# Patient Record
Sex: Female | Born: 1945
Health system: Southern US, Community
[De-identification: ages and names within clinical notes are randomized; demographics above are authoritative.]

## PROBLEM LIST (undated history)

## (undated) DIAGNOSIS — I10 Essential (primary) hypertension: Secondary | ICD-10-CM

## (undated) DIAGNOSIS — K635 Polyp of colon: Secondary | ICD-10-CM

## (undated) DIAGNOSIS — R161 Splenomegaly, not elsewhere classified: Secondary | ICD-10-CM

## (undated) DIAGNOSIS — Z973 Presence of spectacles and contact lenses: Secondary | ICD-10-CM

## (undated) DIAGNOSIS — R112 Nausea with vomiting, unspecified: Secondary | ICD-10-CM

## (undated) DIAGNOSIS — R040 Epistaxis: Secondary | ICD-10-CM

## (undated) DIAGNOSIS — D219 Benign neoplasm of connective and other soft tissue, unspecified: Secondary | ICD-10-CM

## (undated) DIAGNOSIS — K279 Peptic ulcer, site unspecified, unspecified as acute or chronic, without hemorrhage or perforation: Secondary | ICD-10-CM

## (undated) DIAGNOSIS — E039 Hypothyroidism, unspecified: Secondary | ICD-10-CM

## (undated) DIAGNOSIS — R011 Cardiac murmur, unspecified: Secondary | ICD-10-CM

## (undated) DIAGNOSIS — H409 Unspecified glaucoma: Secondary | ICD-10-CM

## (undated) DIAGNOSIS — K219 Gastro-esophageal reflux disease without esophagitis: Secondary | ICD-10-CM

## (undated) DIAGNOSIS — Z9889 Other specified postprocedural states: Secondary | ICD-10-CM

## (undated) DIAGNOSIS — M1990 Unspecified osteoarthritis, unspecified site: Secondary | ICD-10-CM

## (undated) HISTORY — DX: Splenomegaly, not elsewhere classified: R16.1

## (undated) HISTORY — DX: Hypothyroidism, unspecified: E03.9

## (undated) HISTORY — PX: APPENDECTOMY: SHX54

## (undated) HISTORY — DX: Peptic ulcer, site unspecified, unspecified as acute or chronic, without hemorrhage or perforation: K27.9

## (undated) HISTORY — PX: THYROIDECTOMY: SHX17

## (undated) HISTORY — DX: Cardiac murmur, unspecified: R01.1

## (undated) HISTORY — PX: WISDOM TOOTH EXTRACTION: SHX21

## (undated) HISTORY — PX: ABDOMINAL HYSTERECTOMY: SHX81

## (undated) HISTORY — PX: CATARACT EXTRACTION W/ INTRAOCULAR LENS  IMPLANT, BILATERAL: SHX1307

## (undated) HISTORY — DX: Benign neoplasm of connective and other soft tissue, unspecified: D21.9

## (undated) HISTORY — DX: Gastro-esophageal reflux disease without esophagitis: K21.9

## (undated) HISTORY — PX: KNEE SURGERY: SHX244

## (undated) HISTORY — PX: BREAST SURGERY: SHX581

## (undated) HISTORY — DX: Polyp of colon: K63.5

## (undated) HISTORY — PX: CHOLECYSTECTOMY: SHX55

## (undated) HISTORY — PX: HERNIA REPAIR: SHX51

## (undated) HISTORY — PX: DENTAL SURGERY: SHX609

---

## 1998-01-14 ENCOUNTER — Ambulatory Visit: Admission: RE | Admit: 1998-01-14 | Discharge: 1998-01-14 | Payer: Self-pay | Admitting: Family Medicine

## 1998-08-17 ENCOUNTER — Ambulatory Visit: Admission: RE | Admit: 1998-08-17 | Discharge: 1998-08-17 | Payer: Self-pay | Admitting: Family Medicine

## 1998-08-17 ENCOUNTER — Encounter: Payer: Self-pay | Admitting: Family Medicine

## 1998-12-30 ENCOUNTER — Ambulatory Visit (HOSPITAL_COMMUNITY): Admission: RE | Admit: 1998-12-30 | Discharge: 1998-12-30 | Payer: Self-pay | Admitting: Family Medicine

## 1998-12-30 ENCOUNTER — Encounter: Payer: Self-pay | Admitting: Family Medicine

## 1999-01-28 ENCOUNTER — Encounter: Payer: Self-pay | Admitting: Surgery

## 1999-01-31 ENCOUNTER — Encounter: Payer: Self-pay | Admitting: Surgery

## 1999-02-01 ENCOUNTER — Encounter: Payer: Self-pay | Admitting: Surgery

## 1999-02-01 ENCOUNTER — Encounter: Payer: Self-pay | Admitting: Gastroenterology

## 1999-02-02 ENCOUNTER — Inpatient Hospital Stay (HOSPITAL_COMMUNITY): Admission: RE | Admit: 1999-02-02 | Discharge: 1999-02-05 | Payer: Self-pay | Admitting: Surgery

## 1999-02-14 ENCOUNTER — Encounter: Payer: Self-pay | Admitting: Gastroenterology

## 1999-02-14 ENCOUNTER — Ambulatory Visit (HOSPITAL_COMMUNITY): Admission: RE | Admit: 1999-02-14 | Discharge: 1999-02-14 | Payer: Self-pay | Admitting: Gastroenterology

## 2000-03-05 ENCOUNTER — Encounter: Admission: RE | Admit: 2000-03-05 | Discharge: 2000-03-05 | Payer: Self-pay | Admitting: *Deleted

## 2000-03-05 ENCOUNTER — Encounter: Payer: Self-pay | Admitting: *Deleted

## 2001-03-08 ENCOUNTER — Encounter: Admission: RE | Admit: 2001-03-08 | Discharge: 2001-03-08 | Payer: Self-pay | Admitting: *Deleted

## 2001-03-08 ENCOUNTER — Encounter: Payer: Self-pay | Admitting: *Deleted

## 2001-06-29 ENCOUNTER — Emergency Department (HOSPITAL_COMMUNITY): Admission: AC | Admit: 2001-06-29 | Discharge: 2001-06-29 | Payer: Self-pay

## 2001-06-29 ENCOUNTER — Encounter: Payer: Self-pay | Admitting: Emergency Medicine

## 2001-11-29 ENCOUNTER — Encounter: Admission: RE | Admit: 2001-11-29 | Discharge: 2001-11-29 | Payer: Self-pay | Admitting: *Deleted

## 2001-11-29 ENCOUNTER — Encounter: Payer: Self-pay | Admitting: *Deleted

## 2002-01-30 ENCOUNTER — Other Ambulatory Visit: Admission: RE | Admit: 2002-01-30 | Discharge: 2002-01-30 | Payer: Self-pay | Admitting: *Deleted

## 2002-02-18 ENCOUNTER — Ambulatory Visit (HOSPITAL_COMMUNITY): Admission: RE | Admit: 2002-02-18 | Discharge: 2002-02-18 | Payer: Self-pay | Admitting: Gastroenterology

## 2002-02-18 ENCOUNTER — Encounter (INDEPENDENT_AMBULATORY_CARE_PROVIDER_SITE_OTHER): Payer: Self-pay

## 2002-03-11 ENCOUNTER — Encounter: Admission: RE | Admit: 2002-03-11 | Discharge: 2002-03-11 | Payer: Self-pay | Admitting: *Deleted

## 2002-03-11 ENCOUNTER — Encounter: Payer: Self-pay | Admitting: *Deleted

## 2003-03-16 ENCOUNTER — Other Ambulatory Visit: Admission: RE | Admit: 2003-03-16 | Discharge: 2003-03-16 | Payer: Self-pay | Admitting: *Deleted

## 2003-03-18 ENCOUNTER — Encounter: Payer: Self-pay | Admitting: Family Medicine

## 2003-03-18 ENCOUNTER — Encounter: Admission: RE | Admit: 2003-03-18 | Discharge: 2003-03-18 | Payer: Self-pay | Admitting: Family Medicine

## 2003-11-26 ENCOUNTER — Encounter: Admission: RE | Admit: 2003-11-26 | Discharge: 2003-11-26 | Payer: Self-pay | Admitting: Family Medicine

## 2004-02-04 ENCOUNTER — Encounter: Admission: RE | Admit: 2004-02-04 | Discharge: 2004-02-04 | Payer: Self-pay | Admitting: *Deleted

## 2004-02-23 ENCOUNTER — Other Ambulatory Visit: Admission: RE | Admit: 2004-02-23 | Discharge: 2004-02-23 | Payer: Self-pay | Admitting: *Deleted

## 2005-02-02 ENCOUNTER — Other Ambulatory Visit: Admission: RE | Admit: 2005-02-02 | Discharge: 2005-02-02 | Payer: Self-pay | Admitting: *Deleted

## 2005-02-06 ENCOUNTER — Encounter: Admission: RE | Admit: 2005-02-06 | Discharge: 2005-02-06 | Payer: Self-pay | Admitting: *Deleted

## 2005-02-14 ENCOUNTER — Emergency Department (HOSPITAL_COMMUNITY): Admission: EM | Admit: 2005-02-14 | Discharge: 2005-02-14 | Payer: Self-pay | Admitting: Emergency Medicine

## 2005-02-22 ENCOUNTER — Emergency Department (HOSPITAL_COMMUNITY): Admission: EM | Admit: 2005-02-22 | Discharge: 2005-02-22 | Payer: Self-pay | Admitting: *Deleted

## 2006-02-07 ENCOUNTER — Other Ambulatory Visit: Admission: RE | Admit: 2006-02-07 | Discharge: 2006-02-07 | Payer: Self-pay | Admitting: *Deleted

## 2006-02-09 ENCOUNTER — Encounter: Admission: RE | Admit: 2006-02-09 | Discharge: 2006-02-09 | Payer: Self-pay | Admitting: *Deleted

## 2007-02-11 ENCOUNTER — Encounter: Admission: RE | Admit: 2007-02-11 | Discharge: 2007-02-11 | Payer: Self-pay | Admitting: *Deleted

## 2007-04-29 ENCOUNTER — Ambulatory Visit (HOSPITAL_COMMUNITY): Admission: RE | Admit: 2007-04-29 | Discharge: 2007-04-29 | Payer: Self-pay | Admitting: Orthopedic Surgery

## 2007-04-29 ENCOUNTER — Encounter (INDEPENDENT_AMBULATORY_CARE_PROVIDER_SITE_OTHER): Payer: Self-pay | Admitting: Orthopedic Surgery

## 2007-04-29 ENCOUNTER — Ambulatory Visit: Payer: Self-pay | Admitting: Vascular Surgery

## 2007-05-03 ENCOUNTER — Ambulatory Visit (HOSPITAL_COMMUNITY): Admission: RE | Admit: 2007-05-03 | Discharge: 2007-05-03 | Payer: Self-pay | Admitting: Orthopedic Surgery

## 2007-05-23 ENCOUNTER — Other Ambulatory Visit: Admission: RE | Admit: 2007-05-23 | Discharge: 2007-05-23 | Payer: Self-pay | Admitting: *Deleted

## 2008-02-12 ENCOUNTER — Encounter: Admission: RE | Admit: 2008-02-12 | Discharge: 2008-02-12 | Payer: Self-pay | Admitting: Gynecology

## 2008-06-04 ENCOUNTER — Other Ambulatory Visit: Admission: RE | Admit: 2008-06-04 | Discharge: 2008-06-04 | Payer: Self-pay | Admitting: Gynecology

## 2009-01-22 ENCOUNTER — Encounter: Admission: RE | Admit: 2009-01-22 | Discharge: 2009-01-22 | Payer: Self-pay | Admitting: Family Medicine

## 2009-02-10 ENCOUNTER — Encounter (INDEPENDENT_AMBULATORY_CARE_PROVIDER_SITE_OTHER): Payer: Self-pay | Admitting: Interventional Radiology

## 2009-02-10 ENCOUNTER — Encounter: Admission: RE | Admit: 2009-02-10 | Discharge: 2009-02-10 | Payer: Self-pay | Admitting: Family Medicine

## 2009-02-10 ENCOUNTER — Other Ambulatory Visit: Admission: RE | Admit: 2009-02-10 | Discharge: 2009-02-10 | Payer: Self-pay | Admitting: Interventional Radiology

## 2009-02-17 ENCOUNTER — Encounter: Admission: RE | Admit: 2009-02-17 | Discharge: 2009-02-17 | Payer: Self-pay | Admitting: Gynecology

## 2009-05-07 ENCOUNTER — Encounter (INDEPENDENT_AMBULATORY_CARE_PROVIDER_SITE_OTHER): Payer: Self-pay | Admitting: Surgery

## 2009-05-07 ENCOUNTER — Ambulatory Visit (HOSPITAL_COMMUNITY): Admission: RE | Admit: 2009-05-07 | Discharge: 2009-05-08 | Payer: Self-pay | Admitting: Surgery

## 2009-07-18 ENCOUNTER — Inpatient Hospital Stay (HOSPITAL_COMMUNITY): Admission: EM | Admit: 2009-07-18 | Discharge: 2009-07-20 | Payer: Self-pay | Admitting: Emergency Medicine

## 2009-07-19 ENCOUNTER — Encounter (INDEPENDENT_AMBULATORY_CARE_PROVIDER_SITE_OTHER): Payer: Self-pay | Admitting: Internal Medicine

## 2009-07-20 ENCOUNTER — Encounter (INDEPENDENT_AMBULATORY_CARE_PROVIDER_SITE_OTHER): Payer: Self-pay | Admitting: Gastroenterology

## 2010-02-18 ENCOUNTER — Encounter: Admission: RE | Admit: 2010-02-18 | Discharge: 2010-02-18 | Payer: Self-pay | Admitting: Gynecology

## 2011-01-05 LAB — BASIC METABOLIC PANEL
BUN: 5 mg/dL — ABNORMAL LOW (ref 6–23)
CO2: 26 mEq/L (ref 19–32)
CO2: 27 mEq/L (ref 19–32)
Chloride: 103 mEq/L (ref 96–112)
Chloride: 109 mEq/L (ref 96–112)
Creatinine, Ser: 0.76 mg/dL (ref 0.4–1.2)
Creatinine, Ser: 0.78 mg/dL (ref 0.4–1.2)
GFR calc Af Amer: >60 mL/min (ref >60)
GFR calc Af Amer: >60 mL/min (ref >60)
Potassium: 3.5 mEq/L (ref 3.5–5.1)

## 2011-01-05 LAB — DIFFERENTIAL
Basophils Relative: 0 % (ref 0–1)
Lymphocytes Relative: 22 % (ref 12–46)
Neutrophils Relative %: 72 % (ref 43–77)

## 2011-01-05 LAB — COMPREHENSIVE METABOLIC PANEL
AST: 21 U/L (ref 0–37)
Albumin: 3.9 g/dL (ref 3.5–5.2)
Alkaline Phosphatase: 60 U/L (ref 39–117)
Chloride: 107 mEq/L (ref 96–112)
GFR calc Af Amer: >60 mL/min (ref >60)
GFR calc non Af Amer: >60 mL/min (ref >60)
Glucose, Bld: 99 mg/dL (ref 70–99)
Potassium: 3.6 mEq/L (ref 3.5–5.1)
Total Bilirubin: 0.4 mg/dL (ref 0.3–1.2)

## 2011-01-05 LAB — CROSSMATCH: Antibody Screen: NEGATIVE

## 2011-01-05 LAB — LIPASE, BLOOD: Lipase: 65 U/L — ABNORMAL HIGH (ref 11–59)

## 2011-01-05 LAB — URINE CULTURE
Colony Count: NO GROWTH
Colony Count: NO GROWTH
Culture: NO GROWTH
Culture: NO GROWTH
Special Requests: NEGATIVE

## 2011-01-05 LAB — URINALYSIS, ROUTINE W REFLEX MICROSCOPIC
Glucose, UA: NEGATIVE mg/dL
Specific Gravity, Urine: 1.008 (ref 1.005–1.030)
pH: 6.5 (ref 5.0–8.0)

## 2011-01-05 LAB — CBC
HCT: 26.8 % — ABNORMAL LOW (ref 36.0–46.0)
HCT: 28.5 % — ABNORMAL LOW (ref 36.0–46.0)
Hemoglobin: 8.9 g/dL — ABNORMAL LOW (ref 12.0–15.0)
MCHC: 32.4 g/dL (ref 30.0–36.0)
MCHC: 33.2 g/dL (ref 30.0–36.0)
MCHC: 33.4 g/dL (ref 30.0–36.0)
MCV: 84.6 fL (ref 78.0–100.0)
MCV: 85.1 fL (ref 78.0–100.0)
MCV: 85.1 fL (ref 78.0–100.0)
MCV: 85.2 fL (ref 78.0–100.0)
MCV: 85.3 fL (ref 78.0–100.0)
MCV: 85.3 fL (ref 78.0–100.0)
Platelets: 274 10*3/uL (ref 150–400)
Platelets: 356 10*3/uL (ref 150–400)
RBC: 2.78 MIL/uL — ABNORMAL LOW (ref 3.87–5.11)
RBC: 2.97 MIL/uL — ABNORMAL LOW (ref 3.87–5.11)
RBC: 3.12 MIL/uL — ABNORMAL LOW (ref 3.87–5.11)
RBC: 3.14 MIL/uL — ABNORMAL LOW (ref 3.87–5.11)
RBC: 3.35 MIL/uL — ABNORMAL LOW (ref 3.87–5.11)
RDW: 15.9 % — ABNORMAL HIGH (ref 11.5–15.5)
RDW: 16.3 % — ABNORMAL HIGH (ref 11.5–15.5)
WBC: 4.3 10*3/uL (ref 4.0–10.5)
WBC: 5.4 10*3/uL (ref 4.0–10.5)
WBC: 7.4 10*3/uL (ref 4.0–10.5)

## 2011-01-05 LAB — POCT CARDIAC MARKERS
Myoglobin, poc: 27.3 ng/mL (ref 12–200)
Myoglobin, poc: 37.3 ng/mL (ref 12–200)
Troponin i, poc: <0.05 ng/mL (ref 0.00–0.09)

## 2011-01-05 LAB — PROTIME-INR
INR: 1.09 (ref 0.00–1.49)
Prothrombin Time: 14 seconds (ref 11.6–15.2)

## 2011-01-05 LAB — CK TOTAL AND CKMB (NOT AT ARMC): Relative Index: INVALID (ref 0.0–2.5)

## 2011-01-07 LAB — COMPREHENSIVE METABOLIC PANEL
ALT: 15 U/L (ref 0–35)
AST: 21 U/L (ref 0–37)
Albumin: 4.1 g/dL (ref 3.5–5.2)
Alkaline Phosphatase: 66 U/L (ref 39–117)
BUN: 13 mg/dL (ref 6–23)
Chloride: 107 mEq/L (ref 96–112)
GFR calc Af Amer: >60 mL/min (ref >60)
Potassium: 4.3 mEq/L (ref 3.5–5.1)
Sodium: 141 mEq/L (ref 135–145)
Total Bilirubin: 0.6 mg/dL (ref 0.3–1.2)
Total Protein: 7 g/dL (ref 6.0–8.3)

## 2011-01-07 LAB — URINALYSIS, ROUTINE W REFLEX MICROSCOPIC
Glucose, UA: NEGATIVE mg/dL
Hgb urine dipstick: NEGATIVE
Ketones, ur: NEGATIVE mg/dL
Protein, ur: NEGATIVE mg/dL
pH: 6 (ref 5.0–8.0)

## 2011-01-07 LAB — CBC
HCT: 39 % (ref 36.0–46.0)
Platelets: 183 10*3/uL (ref 150–400)
RDW: 14.4 % (ref 11.5–15.5)
WBC: 4.6 10*3/uL (ref 4.0–10.5)

## 2011-01-07 LAB — DIFFERENTIAL
Basophils Absolute: 0 10*3/uL (ref 0.0–0.1)
Basophils Relative: 1 % (ref 0–1)
Eosinophils Relative: 3 % (ref 0–5)
Monocytes Absolute: 0.3 10*3/uL (ref 0.1–1.0)
Monocytes Relative: 7 % (ref 3–12)

## 2011-01-07 LAB — CALCIUM: Calcium: 8 mg/dL — ABNORMAL LOW (ref 8.4–10.5)

## 2011-02-14 NOTE — Op Note (Signed)
NAMESHIRLE, PROVENCAL                 ACCOUNT NO.:  [id number]   MEDICAL RECORD NO.:  [id number]          PATIENT TYPE:  AMB   LOCATION:  DAY                          FACILITY:  [facility]   PHYSICIAN:  Velora Heckler, MD      DATE OF BIRTH:  [birth year]   DATE OF PROCEDURE:  05/07/2009  DATE OF DISCHARGE:                               OPERATIVE REPORT   PREOPERATIVE DIAGNOSIS:  Multinodular thyroid goiter with compressive  symptoms.   POSTOPERATIVE DIAGNOSIS:  Multinodular thyroid goiter with compressive  symptoms.   PROCEDURE:  Total thyroidectomy.   SURGEON:  Velora Heckler, M.D., FACS   ASSISTANT:  Gaynelle Adu, M.D.   ANESTHESIA:  General.   ESTIMATED BLOOD LOSS:  Minimal.   PREPARATION:  ChloraPrep.   COMPLICATIONS:  None.   INDICATIONS:  The patient is a 65 year old white female from [place],  [place].  She is seen at the request of Dr. Talmage Coin for a  dominant right-sided thyroid nodule.  Ultrasound showed a multinodular  gland with a dominant mass in the right lobe containing  microcalcifications.  Fine-needle aspiration showed a follicular lesion.  The patient has developed mild compressive symptoms including dysphagia  and chronic cough.  She now comes to surgery for thyroidectomy.   DESCRIPTION OF PROCEDURE:  Procedure is done in OR #11 at the [facility].  The patient is brought to the operating room,  placed in the supine position on the operating room table.  Following  administration of general anesthesia, the patient is positioned and then  prepped and draped in the usual strict aseptic fashion.  After  ascertaining that an adequate level of anesthesia had been achieved, a  Kocher incision is made with a #15 blade.  Dissection is carried through  subcutaneous tissues and platysma.  Hemostasis is obtained with the  electrocautery.  Skin flaps are elevated cephalad and caudad from the  thyroid notch to the sternal notch.  A Mahorner  self-retaining retractor  is placed for exposure.  Strap muscles are incised in the midline and  dissection is begun on the left side.  Left lobe is exposed.  Left  thyroid lobe is essentially normal in size, although it does contain  multiple small nodules measuring up to 1 cm in diameter.  It is gently  dissected out.  Middle thyroid vein is divided between small Ligaclips  with the Harmonic scalpel.  Superior pole vessels are dissected out and  ligated in continuity with 2-0 silk ties and divided with the Harmonic  scalpel.  Superior parathyroid gland is identified and preserved on its  vascular pedicle.  Inferior venous tributaries are divided between  medium Ligaclips with the Harmonic scalpel.  Parathyroid tissue at the  inferior position is also identified and preserved on its vascular  pedicle.  Branches of the inferior thyroid artery are divided between  small Ligaclips with the Harmonic scalpel.  The recurrent laryngeal  nerve is identified and preserved.  Ligament of Allyson Sabal is transected with  the electrocautery and the gland is mobilized up and onto the  anterior  trachea.  Isthmus is mobilized across the midline.  There is a small  pyramidal lobe which is resected en bloc with the gland.  There is also  a small remnant of thyroid tissue extending inferiorly from the isthmus  which is mobilized.  Venous tributaries are divided between medium  Ligaclips with the Harmonic scalpel.  This was also resected en bloc  with the thyroid.  Dry pack is placed in the left neck.   Next we turned our attention to the right thyroid lobe.  Again strap  muscles are reflected laterally.  The right lobe is markedly enlarged.  It contains a dominant mass extending posteriorly from the mid pole.  Middle thyroid vein is divided between medium Ligaclips with the  Harmonic scalpel.  The superior pole vessels are dissected out and  divided between medium Ligaclips with the Harmonic scalpel.   Parathyroid  tissue is identified and preserved.  Inferior venous tributaries were  divided between medium Ligaclips.  Gland is rolled anteriorly.  Branches  of the inferior thyroid artery are divided between small Ligaclips with  the Harmonic scalpel.  Inferior parathyroid gland is identified and  preserved on its vascular pedicle.  Recurrent nerve is identified and  preserved.  Ligament of Allyson Sabal is transected with the electrocautery and  the gland is mobilized up and onto the anterior trachea.  Inferior  venous tributaries are ligated with 2-0 silk ties and divided with the  Harmonic scalpel.  The entire gland is excised.  Suture is used to mark  the right superior pole.  The entire specimen is submitted to pathology  for review.  Neck is irrigated with warm saline.  Good hemostasis is  noted.  Surgicel is placed in the operative field bilaterally.  Strap  muscles are reapproximated in the midline with interrupted 3-0 Vicryl  sutures.  Platysma is closed with interrupted 3-0 Vicryl sutures.  Skin  is closed with a running 4-0 Monocryl subcuticular suture.  Wound is  washed and dried and Benzoin and Steri-Strips are applied.  Sterile  dressings are applied.  The patient is awakened from anesthesia and  brought to the recovery room in stable condition.  The patient tolerated  the procedure well.      Velora Heckler, MD  Electronically Signed     TMG/MEDQ  D:  05/07/2009  T:  05/07/2009  Job:  [id number]   cc:   Tonita Cong, M.D.   Vianne Bulls, M.D.  Fax: [phone]

## 2016-04-19 ENCOUNTER — Other Ambulatory Visit: Payer: Self-pay | Admitting: Family Medicine

## 2016-04-19 DIAGNOSIS — R1031 Right lower quadrant pain: Secondary | ICD-10-CM

## 2016-05-01 ENCOUNTER — Other Ambulatory Visit: Payer: Self-pay

## 2016-05-02 ENCOUNTER — Ambulatory Visit
Admission: RE | Admit: 2016-05-02 | Discharge: 2016-05-02 | Disposition: A | Discharge status: Home / Self Care | Payer: Medicare Other | Source: Ambulatory Visit | Attending: Family Medicine | Admitting: Family Medicine

## 2016-05-02 DIAGNOSIS — R1031 Right lower quadrant pain: Secondary | ICD-10-CM

## 2016-05-02 MED ORDER — IOPAMIDOL (ISOVUE-300) INJECTION 61%
100.0000 mL | Freq: Once | INTRAVENOUS | Status: AC | PRN
Start: 2016-05-02 — End: 2016-05-02
  Administered 2016-05-02: 100 mL via INTRAVENOUS

## 2016-05-04 ENCOUNTER — Other Ambulatory Visit: Payer: Self-pay | Admitting: Family Medicine

## 2016-05-04 DIAGNOSIS — R935 Abnormal findings on diagnostic imaging of other abdominal regions, including retroperitoneum: Secondary | ICD-10-CM

## 2016-05-12 ENCOUNTER — Ambulatory Visit
Admission: RE | Admit: 2016-05-12 | Discharge: 2016-05-12 | Disposition: A | Discharge status: Home / Self Care | Payer: Medicare Other | Source: Ambulatory Visit | Attending: Family Medicine | Admitting: Family Medicine

## 2016-05-12 DIAGNOSIS — R935 Abnormal findings on diagnostic imaging of other abdominal regions, including retroperitoneum: Secondary | ICD-10-CM

## 2016-05-12 MED ORDER — GADOBENATE DIMEGLUMINE 529 MG/ML IV SOLN
15.0000 mL | Freq: Once | INTRAVENOUS | Status: AC | PRN
  Administered 2016-05-12: 15 mL via INTRAVENOUS

## 2016-05-15 ENCOUNTER — Ambulatory Visit
Admission: RE | Admit: 2016-05-15 | Discharge: 2016-05-15 | Disposition: A | Discharge status: Home / Self Care | Payer: Medicare Other | Source: Ambulatory Visit | Attending: Family Medicine | Admitting: Family Medicine

## 2016-05-15 ENCOUNTER — Encounter: Payer: Self-pay | Admitting: Radiology

## 2016-05-15 DIAGNOSIS — R935 Abnormal findings on diagnostic imaging of other abdominal regions, including retroperitoneum: Secondary | ICD-10-CM

## 2016-05-15 MED ORDER — IOPAMIDOL (ISOVUE-300) INJECTION 61%
75.0000 mL | Freq: Once | INTRAVENOUS | Status: AC | PRN
  Administered 2016-05-15: 75 mL via INTRAVENOUS

## 2016-07-20 ENCOUNTER — Other Ambulatory Visit: Payer: Self-pay | Admitting: Family Medicine

## 2016-07-24 ENCOUNTER — Ambulatory Visit
Admission: RE | Admit: 2016-07-24 | Discharge: 2016-07-24 | Disposition: A | Discharge status: Home / Self Care | Payer: Self-pay | Source: Ambulatory Visit | Attending: Family Medicine | Admitting: Family Medicine

## 2016-08-12 DIAGNOSIS — S065XAA Traumatic subdural hemorrhage with loss of consciousness status unknown, initial encounter: Secondary | ICD-10-CM | POA: Insufficient documentation

## 2016-08-16 ENCOUNTER — Other Ambulatory Visit: Payer: Self-pay | Admitting: Family Medicine

## 2016-08-16 DIAGNOSIS — S3600XA Unspecified injury of spleen, initial encounter: Secondary | ICD-10-CM

## 2016-08-30 ENCOUNTER — Ambulatory Visit
Admission: RE | Admit: 2016-08-30 | Discharge: 2016-08-30 | Disposition: A | Discharge status: Home / Self Care | Payer: Medicare Other | Source: Ambulatory Visit | Attending: Family Medicine | Admitting: Family Medicine

## 2016-08-30 DIAGNOSIS — S3600XA Unspecified injury of spleen, initial encounter: Secondary | ICD-10-CM

## 2016-08-30 MED ORDER — GADOBENATE DIMEGLUMINE 529 MG/ML IV SOLN
15.0000 mL | Freq: Once | INTRAVENOUS | Status: AC | PRN
  Administered 2016-08-30: 15 mL via INTRAVENOUS

## 2016-10-20 ENCOUNTER — Other Ambulatory Visit: Payer: Self-pay | Admitting: Neurological Surgery

## 2016-10-20 DIAGNOSIS — S065X0A Traumatic subdural hemorrhage without loss of consciousness, initial encounter: Secondary | ICD-10-CM

## 2016-10-23 ENCOUNTER — Ambulatory Visit
Admission: RE | Admit: 2016-10-23 | Discharge: 2016-10-23 | Disposition: A | Discharge status: Home / Self Care | Payer: Medicare Other | Source: Ambulatory Visit | Attending: Neurological Surgery | Admitting: Neurological Surgery

## 2016-10-23 DIAGNOSIS — S065X0A Traumatic subdural hemorrhage without loss of consciousness, initial encounter: Secondary | ICD-10-CM

## 2018-02-05 DIAGNOSIS — R2232 Localized swelling, mass and lump, left upper limb: Secondary | ICD-10-CM | POA: Insufficient documentation

## 2018-02-13 ENCOUNTER — Other Ambulatory Visit: Payer: Self-pay

## 2018-04-15 ENCOUNTER — Other Ambulatory Visit: Payer: Self-pay | Admitting: Obstetrics & Gynecology

## 2018-04-15 DIAGNOSIS — N644 Mastodynia: Secondary | ICD-10-CM

## 2018-04-18 ENCOUNTER — Ambulatory Visit
Admission: RE | Admit: 2018-04-18 | Discharge: 2018-04-18 | Disposition: A | Discharge status: Home / Self Care | Payer: Medicare Other | Source: Ambulatory Visit | Attending: Obstetrics & Gynecology | Admitting: Obstetrics & Gynecology

## 2018-04-18 ENCOUNTER — Ambulatory Visit: Admission: RE | Admit: 2018-04-18 | Payer: Medicare Other | Source: Ambulatory Visit

## 2018-04-18 DIAGNOSIS — N644 Mastodynia: Secondary | ICD-10-CM

## 2018-10-16 ENCOUNTER — Emergency Department (HOSPITAL_BASED_OUTPATIENT_CLINIC_OR_DEPARTMENT_OTHER)
Admission: EM | Admit: 2018-10-16 | Discharge: 2018-10-16 | Disposition: A | Discharge status: Home / Self Care | Payer: Medicare Other | Attending: Emergency Medicine | Admitting: Emergency Medicine

## 2018-10-16 ENCOUNTER — Other Ambulatory Visit: Payer: Self-pay

## 2018-10-16 ENCOUNTER — Encounter (HOSPITAL_BASED_OUTPATIENT_CLINIC_OR_DEPARTMENT_OTHER): Payer: Self-pay | Admitting: *Deleted

## 2018-10-16 DIAGNOSIS — R040 Epistaxis: Secondary | ICD-10-CM

## 2018-10-16 HISTORY — DX: Epistaxis: R04.0

## 2018-10-16 MED ORDER — SILVER NITRATE-POT NITRATE 75-25 % EX MISC
1.0000 "application " | Freq: Once | CUTANEOUS | Status: DC
  Filled 2018-10-16: qty 1

## 2018-10-16 NOTE — ED Provider Notes (Signed)
[place] EMERGENCY DEPARTMENT Provider Note   CSN: [id number] Arrival date & time: 10/16/18  1514     History   Chief Complaint Chief Complaint  Patient presents with  . Epistaxis    HPI Miranda Russell is a 73 y.o. female.  HPI Patient presents to the emergency department with a nosebleed that started about 30 minutes prior to arrival.  The patient states that she feels like is coming from the front part of the left nose.  Patient states she has had to these over the last 3 weeks.  Patient states seems to happen mostly in the wintertime.  The patient states she did not take any medications prior to arrival for her symptoms.  She states she did apply pressure.  She denies any headache, blurred vision, weakness, numbness, dizziness, neck pain, shortness of breath or syncope. Past Medical History:  Diagnosis Date  . Epistaxis     There are no active problems to display for this patient.   History reviewed. No pertinent surgical history.   OB History   No obstetric history on file.      Home Medications    Prior to Admission medications   Not on File    Family History Family History  Problem Relation Age of Onset  . Breast cancer Neg Hx     Social History Social History   Tobacco Use  . Smoking status: Never Smoker  . Smokeless tobacco: Never Used  Substance Use Topics  . Alcohol use: Not on file  . Drug use: Never     Allergies   Morphine and related and Codeine   Review of Systems Review of Systems  All other systems negative except as documented in the HPI. All pertinent positives and negatives as reviewed in the HPI. Physical Exam Updated Vital Signs BP (!) 162/101 (BP Location: Right Arm)   Pulse 88   Temp 97.7 F (36.5 C) (Oral)   Resp 16   Ht 5' 6.5" (1.689 m)   Wt 75.3 kg   SpO2 98%   BMI 26.39 kg/m   Physical Exam Vitals signs and nursing note reviewed.  Constitutional:      General: She is not in acute distress.   Appearance: She is well-developed.  HENT:     Head: Normocephalic and atraumatic.     Nose:   Eyes:     Pupils: Pupils are equal, round, and reactive to light.  Pulmonary:     Effort: Pulmonary effort is normal.  Skin:    General: Skin is warm and dry.  Neurological:     Mental Status: She is alert and oriented to person, place, and time.      ED Treatments / Results  Labs (all labs ordered are listed, but only abnormal results are displayed) Labs Reviewed - No data to display  EKG None  Radiology No results found.  Procedures .Epistaxis Management Date/Time: 10/16/2018 5:16 PM Performed by: Dalia Heading, PA-C Authorized by: Dalia Heading, PA-C   Consent:    Risks discussed:  Pain Procedure details:    Treatment site:  R anterior and L anterior   Treatment method:  Silver nitrate   Treatment complexity:  Limited   Treatment episode: initial   Post-procedure details:    Assessment:  Bleeding stopped   Patient tolerance of procedure:  Tolerated well, no immediate complications   (including critical care time)  Medications Ordered in ED Medications  silver nitrate applicators applicator 1 application (has no administration  in time range)     Initial Impression / Assessment and Plan / ED Course  I have reviewed the triage vital signs and the nursing notes.  Pertinent labs & imaging results that were available during my care of the patient were reviewed by me and considered in my medical decision making (see chart for details).     Patient was observed for about 30 minutes following the silver nitrate.  She does not have any bleeding.  I advised her to minimize manipulation of the nose.  Patient agrees to plan and all questions were answered.  I did advise her to follow-up with her doctor.  Final Clinical Impressions(s) / ED Diagnoses   Final diagnoses:  None    ED Discharge Orders    None       Rebeca Allegra 10/16/18  1717    Jola Schmidt, MD 10/16/18 [phone]

## 2018-10-16 NOTE — Discharge Instructions (Addendum)
Return here as needed.  Follow-up with your primary doctor. °

## 2018-10-16 NOTE — ED Triage Notes (Signed)
Nose has been bleeding for 20 mins. Pt went to urgent care and was sent here.

## 2018-11-04 ENCOUNTER — Emergency Department (HOSPITAL_BASED_OUTPATIENT_CLINIC_OR_DEPARTMENT_OTHER)
Admission: EM | Admit: 2018-11-04 | Discharge: 2018-11-04 | Disposition: A | Discharge status: Home / Self Care | Payer: Medicare Other | Attending: Emergency Medicine | Admitting: Emergency Medicine

## 2018-11-04 ENCOUNTER — Encounter (HOSPITAL_BASED_OUTPATIENT_CLINIC_OR_DEPARTMENT_OTHER): Payer: Self-pay

## 2018-11-04 ENCOUNTER — Other Ambulatory Visit: Payer: Self-pay

## 2018-11-04 DIAGNOSIS — R040 Epistaxis: Secondary | ICD-10-CM | POA: Diagnosis not present

## 2018-11-04 DIAGNOSIS — I10 Essential (primary) hypertension: Secondary | ICD-10-CM | POA: Insufficient documentation

## 2018-11-04 HISTORY — DX: Essential (primary) hypertension: I10

## 2018-11-04 MED ORDER — OXYMETAZOLINE HCL 0.05 % NA SOLN
1.0000 | Freq: Once | NASAL | Status: AC
  Administered 2018-11-04: 1 via NASAL
  Filled 2018-11-04: qty 30

## 2018-11-04 MED ORDER — SILVER NITRATE-POT NITRATE 75-25 % EX MISC
1.0000 | Freq: Once | CUTANEOUS | Status: AC
  Administered 2018-11-04: 1 via TOPICAL
  Filled 2018-11-04: qty 1

## 2018-11-04 NOTE — Discharge Instructions (Addendum)
If you have recurrent nosebleed please follow instructions below: Whichever side of your nose is bleeding, squeeze the other side and blow out forcefully to remove all the blood and clot that is bleeding. Right after blowing all the blood and clot spray 3-4 squirts of Afrin in the side that was bleeding until you feel it draining down the back of your throat.  Immediately hold pressure on the soft part of your nose for no less than 10 minutes.  If you accidentally let up start over again. If this does not work repeat steps 1 through 3 one time and if it is still bleeding after that return to the emergency department. Do not hold your head backwards.  Sometimes ice can help.

## 2018-11-04 NOTE — ED Triage Notes (Signed)
C/o nose bleed since 640pm-states she was seen here for same ~2weeks ago-NAD-bleeding from left nare has stopped at this time-NAD-steady gait

## 2018-11-05 NOTE — ED Provider Notes (Signed)
Emergency Department Provider Note   I have reviewed the triage vital signs and the nursing notes.   HISTORY  Chief Complaint Epistaxis   HPI Miranda Russell is a 73 y.o. female who had approximately 1 hour of nosebleed prior to my evaluation but stopped.  Patient states is happened before with high blood pressure.  She does not remember putting foreign objects in her nose.  No other associated symptoms.  No vomiting. No other associated or modifying symptoms.    Past Medical History:  Diagnosis Date  . Epistaxis   . Hypertension     There are no active problems to display for this patient.   Past Surgical History:  Procedure Laterality Date  . ABDOMINAL HYSTERECTOMY    . BREAST SURGERY    . CHOLECYSTECTOMY    . KNEE SURGERY    . THYROIDECTOMY        Allergies Morphine and related and Codeine  Family History  Problem Relation Age of Onset  . Breast cancer Neg Hx     Social History Social History   Tobacco Use  . Smoking status: Never Smoker  . Smokeless tobacco: Never Used  Substance Use Topics  . Alcohol use: Yes    Comment: occ  . Drug use: Never    Review of Systems  All other systems negative except as documented in the HPI. All pertinent positives and negatives as reviewed in the HPI. ____________________________________________   PHYSICAL EXAM:  VITAL SIGNS: ED Triage Vitals  Enc Vitals Group     BP 11/04/18 1931 (!) 186/101     Pulse Rate 11/04/18 1931 83     Resp 11/04/18 1931 18     Temp 11/04/18 1931 97.8 F (36.6 C)     Temp Source 11/04/18 1931 Oral     SpO2 11/04/18 1931 97 %     Weight 11/04/18 1931 168 lb (76.2 kg)     Height 11/04/18 1931 5\' 6"  (1.676 m)    Constitutional: Alert and oriented. Well appearing and in no acute distress. Eyes: Conjunctivae are normal. PERRL. EOMI. Head: Atraumatic. Nose: No congestion/rhinnorhea.  Small area that appeared that it was bleeding earlier today on the nasal septum in the left  nare. Mouth/Throat: Mucous membranes are moist.  Oropharynx non-erythematous. Neck: No stridor.  No meningeal signs.   Cardiovascular: Normal rate, regular rhythm. Good peripheral circulation. Grossly normal heart sounds.   Respiratory: Normal respiratory effort.  No retractions. Lungs CTAB. Gastrointestinal: Soft and nontender. No distention.  Musculoskeletal: No lower extremity tenderness nor edema. No gross deformities of extremities. Neurologic:  Normal speech and language. No gross focal neurologic deficits are appreciated.  Skin:  Skin is warm, dry and intact. No rash noted.  _________________________________________    INITIAL IMPRESSION / ASSESSMENT AND PLAN / ED COURSE  Cauterize that area that appeared to have bleeding recently. Instructions for treatmetn at home.   Pertinent labs & imaging results that were available during my care of the patient were reviewed by me and considered in my medical decision making (see chart for details).  ____________________________________________  FINAL CLINICAL IMPRESSION(S) / ED DIAGNOSES  Final diagnoses:  Epistaxis    MEDICATIONS GIVEN DURING THIS VISIT:  Medications  silver nitrate applicators applicator 1 Stick (1 Stick Topical Given 11/04/18 2135)  oxymetazoline (AFRIN) 0.05 % nasal spray 1 spray (1 spray Each Nare Given 11/04/18 2135)     NEW OUTPATIENT MEDICATIONS STARTED DURING THIS VISIT:  There are no discharge medications for this  patient.   Note:  This note was prepared with assistance of Dragon voice recognition software. Occasional wrong-word or sound-a-like substitutions may have occurred due to the inherent limitations of voice recognition software.   Orelia Brandstetter, Corene Cornea, MD 11/05/18 [phone]

## 2018-11-21 DIAGNOSIS — R040 Epistaxis: Secondary | ICD-10-CM

## 2018-11-21 HISTORY — DX: Epistaxis: R04.0

## 2018-12-05 DIAGNOSIS — K219 Gastro-esophageal reflux disease without esophagitis: Secondary | ICD-10-CM | POA: Insufficient documentation

## 2018-12-05 DIAGNOSIS — I10 Essential (primary) hypertension: Secondary | ICD-10-CM | POA: Insufficient documentation

## 2018-12-05 DIAGNOSIS — E039 Hypothyroidism, unspecified: Secondary | ICD-10-CM | POA: Insufficient documentation

## 2019-01-22 ENCOUNTER — Other Ambulatory Visit: Payer: Self-pay

## 2019-01-22 ENCOUNTER — Emergency Department (HOSPITAL_COMMUNITY)
Admission: EM | Admit: 2019-01-22 | Discharge: 2019-01-22 | Disposition: A | Discharge status: Home / Self Care | Payer: Medicare Other | Attending: Emergency Medicine | Admitting: Emergency Medicine

## 2019-01-22 ENCOUNTER — Encounter (HOSPITAL_COMMUNITY): Payer: Self-pay | Admitting: *Deleted

## 2019-01-22 DIAGNOSIS — R040 Epistaxis: Secondary | ICD-10-CM | POA: Diagnosis not present

## 2019-01-22 DIAGNOSIS — I10 Essential (primary) hypertension: Secondary | ICD-10-CM | POA: Diagnosis not present

## 2019-01-22 MED ORDER — LIDOCAINE-EPINEPHRINE-TETRACAINE (LET) SOLUTION
3.0000 mL | Freq: Once | NASAL | Status: AC
  Administered 2019-01-22: 3 mL via TOPICAL
  Filled 2019-01-22: qty 3

## 2019-01-22 MED ORDER — CEPHALEXIN 500 MG PO CAPS: 500.0000 mg | ORAL_CAPSULE | Freq: Two times a day (BID) | ORAL | 0 refills | Status: DC

## 2019-01-22 NOTE — ED Provider Notes (Signed)
[place] EMERGENCY DEPARTMENT Provider Note   CSN: [id number] Arrival date & time: 01/22/19  2059    History   Chief Complaint Chief Complaint  Patient presents with  . Epistaxis    HPI NOAM FRANZEN is a 73 y.o. female.     HPI   73 year old female with epistaxis.  History of the same.  Began again this evening from the right side.  Occurred around 1900.  Denies any trauma.  She is not anticoagulated.  No dizziness or lightheadedness.  She is spitting up blood.  EMS tried Afrin without improvement.  Denies any pain.  Past Medical History:  Diagnosis Date  . Epistaxis   . Hypertension     There are no active problems to display for this patient.   Past Surgical History:  Procedure Laterality Date  . ABDOMINAL HYSTERECTOMY    . BREAST SURGERY    . CHOLECYSTECTOMY    . KNEE SURGERY    . THYROIDECTOMY       OB History   No obstetric history on file.      Home Medications    Prior to Admission medications   Not on File    Family History Family History  Problem Relation Age of Onset  . Breast cancer Neg Hx     Social History Social History   Tobacco Use  . Smoking status: Never Smoker  . Smokeless tobacco: Never Used  Substance Use Topics  . Alcohol use: Yes    Comment: occ  . Drug use: Never     Allergies   Morphine and related and Codeine   Review of Systems Review of Systems  All systems reviewed and negative, other than as noted in HPI.  Physical Exam Updated Vital Signs BP (!) 193/119 (BP Location: Left Arm)   Pulse 94   Temp 97.9 F (36.6 C) (Oral)   Resp 16   Ht 5\' 6"  (1.676 m)   Wt 75.3 kg   SpO2 98%   BMI 26.79 kg/m   Physical Exam Vitals signs and nursing note reviewed.  Constitutional:      General: She is not in acute distress.    Appearance: She is well-developed.  HENT:     Head: Normocephalic.     Nose:     Comments: Significant epistaxis from R side and also spitting up blood/clot.  Not anterior.  Eyes:     General:        Right eye: No discharge.        Left eye: No discharge.     Conjunctiva/sclera: Conjunctivae normal.  Neck:     Musculoskeletal: Neck supple.  Cardiovascular:     Rate and Rhythm: Normal rate and regular rhythm.     Heart sounds: Normal heart sounds. No murmur. No friction rub. No gallop.   Pulmonary:     Effort: Pulmonary effort is normal. No respiratory distress.     Breath sounds: Normal breath sounds.  Abdominal:     General: There is no distension.     Palpations: Abdomen is soft.     Tenderness: There is no abdominal tenderness.  Musculoskeletal:        General: No tenderness.  Skin:    General: Skin is warm and dry.  Neurological:     Mental Status: She is alert.  Psychiatric:        Behavior: Behavior normal.        Thought Content: Thought content normal.  ED Treatments / Results  Labs (all labs ordered are listed, but only abnormal results are displayed) Labs Reviewed - No data to display  EKG None  Radiology No results found.  Procedures .Epistaxis Management Date/Time: 01/24/2019 10:59 PM Performed by: Virgel Manifold, MD Authorized by: Virgel Manifold, MD   Consent:    Consent obtained:  Verbal   Consent given by:  Patient   Risks discussed:  Nasal injury and pain Anesthesia (see MAR for exact dosages):    Anesthesia method:  Topical application   Topical anesthetic:  LET Procedure details:    Treatment site:  R posterior   Treatment method:  Nasal balloon   Treatment complexity:  Extensive   Treatment episode: initial   Post-procedure details:    Assessment:  Bleeding stopped   Patient tolerance of procedure:  Tolerated well, no immediate complications   (including critical care time)  Medications Ordered in ED Medications  lidocaine-EPINEPHrine-tetracaine (LET) solution (3 mLs Topical Given 01/22/19 2129)     Initial Impression / Assessment and Plan / ED Course  I have reviewed the triage  vital signs and the nursing notes.  Pertinent labs & imaging results that were available during my care of the patient were reviewed by me and considered in my medical decision making (see chart for details).        73yF with epistaxis. Has been seen recently for the same in the ED and by ENT. Has previously had L anterior septal bleeds. R posterior today. Able to get away with placement of 7.5 rhinorocket. Observed for 1 hour post placement and no further bleeding. ENT FU. Prophylactic abx.   Final Clinical Impressions(s) / ED Diagnoses   Final diagnoses:  Epistaxis    ED Discharge Orders    None       Virgel Manifold, MD 01/24/19 2317

## 2019-01-22 NOTE — ED Notes (Signed)
MD aware of pt BP. Assured pt to take BP medication as follows.

## 2019-01-22 NOTE — ED Triage Notes (Signed)
The pt arrived by gems from home  She has had a nosebleed since 1900 today  Actively bleeding on arrival.  She last had a nosebleed 2 months ago..  Ems attempted afrin but the bleeding was so active that the afrin was not effective.  No pain anywhere

## 2019-03-25 ENCOUNTER — Other Ambulatory Visit: Payer: Self-pay | Admitting: Family Medicine

## 2019-03-25 DIAGNOSIS — R9389 Abnormal findings on diagnostic imaging of other specified body structures: Secondary | ICD-10-CM

## 2019-03-28 ENCOUNTER — Other Ambulatory Visit: Payer: Self-pay | Admitting: Family Medicine

## 2019-03-28 DIAGNOSIS — K7689 Other specified diseases of liver: Secondary | ICD-10-CM

## 2019-03-28 DIAGNOSIS — R161 Splenomegaly, not elsewhere classified: Secondary | ICD-10-CM

## 2019-04-07 ENCOUNTER — Ambulatory Visit (INDEPENDENT_AMBULATORY_CARE_PROVIDER_SITE_OTHER): Payer: Medicare Other

## 2019-04-07 ENCOUNTER — Other Ambulatory Visit: Payer: Self-pay

## 2019-04-07 ENCOUNTER — Ambulatory Visit: Payer: Medicare Other

## 2019-04-07 DIAGNOSIS — K7689 Other specified diseases of liver: Secondary | ICD-10-CM

## 2019-04-07 DIAGNOSIS — R9389 Abnormal findings on diagnostic imaging of other specified body structures: Secondary | ICD-10-CM

## 2019-04-07 DIAGNOSIS — R161 Splenomegaly, not elsewhere classified: Secondary | ICD-10-CM

## 2019-04-07 MED ORDER — GADOBUTROL 1 MMOL/ML IV SOLN
7.5000 mL | Freq: Once | INTRAVENOUS | Status: AC | PRN
  Administered 2019-04-07: 11:00:00 7.5 mL via INTRAVENOUS

## 2019-05-07 ENCOUNTER — Telehealth: Payer: Self-pay

## 2019-05-07 NOTE — Telephone Encounter (Signed)
ATC Patient but she did not answer. Could not leave a VM due to her inbox being full. Will call back later.

## 2019-05-08 NOTE — Telephone Encounter (Signed)
Spoke with patient. She states Dr. Harrington Challenger referred her to our office but cannot find the referral I am going to route this to [facility] and see if she has any information   Patrice please advise

## 2019-05-08 NOTE — Telephone Encounter (Signed)
PT RETURNING CALL AGAIN AND CAN BE REACHED @ [phone].Miranda Russell

## 2019-05-08 NOTE — Telephone Encounter (Signed)
Attempted to call pt but unable to reach and unable to leave a VM due to mailbox being full. Will try to call back later.

## 2019-05-08 NOTE — Telephone Encounter (Signed)
PT RETURNING CALL AND CAN BE REACHED @ [phone].Miranda Russell

## 2019-05-09 NOTE — Telephone Encounter (Signed)
Called and scheduled patient with Dr. Carlis Abbott on 05/12/2019-pr

## 2019-05-12 ENCOUNTER — Other Ambulatory Visit: Payer: Self-pay

## 2019-05-12 ENCOUNTER — Institutional Professional Consult (permissible substitution): Payer: Medicare Other | Admitting: Critical Care Medicine

## 2019-05-12 ENCOUNTER — Ambulatory Visit: Payer: Medicare Other | Admitting: Critical Care Medicine

## 2019-05-12 ENCOUNTER — Encounter: Payer: Self-pay | Admitting: Critical Care Medicine

## 2019-05-12 VITALS — BP 150/84 | HR 79 | Temp 98.0°F | Ht 66.0 in | Wt 169.0 lb

## 2019-05-12 DIAGNOSIS — J9859 Other diseases of mediastinum, not elsewhere classified: Secondary | ICD-10-CM | POA: Diagnosis not present

## 2019-05-12 DIAGNOSIS — R911 Solitary pulmonary nodule: Secondary | ICD-10-CM | POA: Diagnosis not present

## 2019-05-12 DIAGNOSIS — R161 Splenomegaly, not elsewhere classified: Secondary | ICD-10-CM

## 2019-05-12 NOTE — Patient Instructions (Signed)
PET scan  Follow up in about 1 month after PET

## 2019-05-12 NOTE — Progress Notes (Signed)
   Subjective:    Patient ID: Miranda Russell, female    DOB: [birth year], 73 y.o.   MRN: [id number]  HPI    Review of Systems  Constitutional: Positive for fatigue. Negative for fever and unexpected weight change.  HENT: Negative for congestion, dental problem, ear pain, nosebleeds, postnasal drip, rhinorrhea, sinus pressure, sneezing, sore throat and trouble swallowing.   Eyes: Negative for redness and itching.  Respiratory: Positive for shortness of breath. Negative for cough, chest tightness and wheezing.   Cardiovascular: Negative for palpitations and leg swelling.  Gastrointestinal: Positive for diarrhea. Negative for nausea and vomiting.  Genitourinary: Negative for dysuria.  Musculoskeletal: Negative for joint swelling.  Skin: Negative for rash.  Allergic/Immunologic: Negative.  Negative for environmental allergies, food allergies and immunocompromised state.  Neurological: Negative for headaches.  Hematological: Bruises/bleeds easily.  Psychiatric/Behavioral: Negative for dysphoric mood. The patient is not nervous/anxious.        Objective:   Physical Exam        Assessment & Plan:

## 2019-05-12 NOTE — Progress Notes (Signed)
Synopsis: Referred for abnormal chest CT by Lawerance Cruel, MD  Subjective:   PATIENT ID: Miranda Russell GENDER: female DOB: [birth year], MRN: [id number]  Chief Complaint  Patient presents with   Consult    abnormal CT    HPI Miranda Russell is a 73 y/o woman being referred for evaluation of an abnormal chest CT scan. Two years ago she had a previous CT scan to evaluate pain on the right side of her abdomen. At that time a small pleural based nodule was discovered. She continues to have the same right- sided pain, but it only is bothersome when she attempts to sleep on her right side. This follow up CT scan to reevaluate her nodule demonstrated a new anterior mediastinal mass. She denies chest pain, dysphagia, and SOB at rest, adenopathy, cough, or sputum production. Since her episodes of severe epistaxis last spring, she complains of DOE with having less endurance than before.  She has chronic anterior midline upper abdominal fullness.   She has a history of a known splenic mass that is followed by her GI physician, Dr. Oletta Lamas with [facility], in addition to her PUD and GERD. She has an outpatient EGD coming up. She has a history of multiple benign tumors- she has undergone breast surgeries for fibroid masses, a hysterectomy for a tumor, and underwent a thyroidectomy for a goiter, in late 1990s. She has no personal history of malignancy, but had multiple secondary family members (several of her mother's 15 siblings) with cancers. She is not aware of a family history of lymphoma. She is a never smoker.    Past Medical History:  Diagnosis Date   Colon polyp    Epistaxis    Fibroids    GERD (gastroesophageal reflux disease)    Hypertension    Hypothyroidism    had thyroid removed   Murmur    Peptic ulcer disease    Splenic mass      Family History  Problem Relation Age of Onset   Heart failure Mother    COPD Father    Dementia Father    COPD Sister    Lung cancer  Brother    COPD Brother    Lung cancer Maternal Aunt    Heart failure Maternal Aunt    Coronary artery disease Maternal Grandfather    Breast cancer Neg Hx      Past Surgical History:  Procedure Laterality Date   ABDOMINAL HYSTERECTOMY     BREAST SURGERY     CHOLECYSTECTOMY     KNEE SURGERY     THYROIDECTOMY      Social History   Socioeconomic History   Marital status: Married    Spouse name: Not on file   Number of children: Not on file   Years of education: Not on file   Highest education level: Not on file  Occupational History   Not on file  Social Needs   Financial resource strain: Not on file   Food insecurity    Worry: Not on file    Inability: Not on file   Transportation needs    Medical: Not on file    Non-medical: Not on file  Tobacco Use   Smoking status: Never Smoker   Smokeless tobacco: Never Used  Substance and Sexual Activity   Alcohol use: Yes    Comment: occ   Drug use: Never   Sexual activity: Not on file  Lifestyle   Physical activity    Days per week:  Not on file    Minutes per session: Not on file   Stress: Not on file  Relationships   Social connections    Talks on phone: Not on file    Gets together: Not on file    Attends religious service: Not on file    Active member of club or organization: Not on file    Attends meetings of clubs or organizations: Not on file    Relationship status: Not on file   Intimate partner violence    Fear of current or ex partner: Not on file    Emotionally abused: Not on file    Physically abused: Not on file    Forced sexual activity: Not on file  Other Topics Concern   Not on file  Social History Narrative   Not on file     Allergies  Allergen Reactions   Morphine And Related Itching    PATIENT   Codeine     PATIENT   Keppra [Levetiracetam] Hives and Swelling     Outpatient Medications Prior to Visit  Medication Sig Dispense Refill   acetaminophen  (TYLENOL) 325 MG tablet Take 650 mg by mouth every 8 (eight) hours as needed.     cephALEXin (KEFLEX) 500 MG capsule Take 1 capsule (500 mg total) by mouth 2 (two) times daily. 10 capsule 0   irbesartan (AVAPRO) 300 MG tablet Take 300 mg by mouth daily.     latanoprost (XALATAN) 0.005 % ophthalmic solution Place 1 drop into both eyes at bedtime.     Multiple Vitamin (MULTIVITAMIN) tablet Take 1 tablet by mouth daily.     NATURE-THROID 81.25 MG TABS Take 1 tablet by mouth daily.     pantoprazole (PROTONIX) 40 MG tablet Take 40 mg by mouth 2 (two) times daily.     verapamil (CALAN-SR) 240 MG CR tablet Take 240 mg by mouth daily.     Vitamin D, Ergocalciferol, (DRISDOL) 1.25 MG (50000 UT) CAPS capsule Take 50,000 Units by mouth once a week.     No facility-administered medications prior to visit.     Review of Systems  Constitutional: Positive for malaise/fatigue. Negative for chills, diaphoresis, fever and weight loss.  HENT: Negative for congestion, ear pain and sore throat.   Respiratory: Positive for shortness of breath. Negative for cough, hemoptysis, sputum production and wheezing.   Cardiovascular: Negative for chest pain, palpitations and leg swelling.  Gastrointestinal: Positive for abdominal pain. Negative for heartburn and nausea.  Genitourinary: Negative for frequency.  Musculoskeletal: Negative for joint pain and myalgias.  Skin: Negative for itching and rash.  Neurological: Negative for dizziness, weakness and headaches.  Endo/Heme/Allergies: Bruises/bleeds easily.  Psychiatric/Behavioral: Negative for depression. The patient is not nervous/anxious.      Objective:  Physical Exam Vitals signs and nursing note reviewed.  Constitutional:      General: She is not in acute distress.    Appearance: Normal appearance. She is not ill-appearing.  HENT:     Head: Normocephalic and atraumatic.     Nose: No rhinorrhea.     Mouth/Throat:     Mouth: Mucous membranes are  moist.     Pharynx: No oropharyngeal exudate.     Comments: Mallampati 2 Eyes:     General: No scleral icterus. Neck:     Musculoskeletal: Neck supple.  Cardiovascular:     Rate and Rhythm: Normal rate and regular rhythm.     Heart sounds: No murmur.  Pulmonary:     Comments: No  tachypnea or conversational dyspnea. CTAB.  Abdominal:     Palpations: Abdomen is soft.     Comments: Splenomegaly, no hepatomegaly. Mild TTP lateral right lower ribs.   Musculoskeletal:        General: No swelling or deformity.  Skin:    General: Skin is warm and dry.     Coloration: Skin is not jaundiced or pale.     Findings: No rash.  Neurological:     General: No focal deficit present.     Mental Status: She is alert.     Motor: No weakness.     Coordination: Coordination normal.  Psychiatric:        Mood and Affect: Mood normal.        Behavior: Behavior normal.      Vitals:   05/12/19 1023  BP: (!) 150/84  Pulse: 79  Temp: 98 F (36.7 C)  TempSrc: Oral  SpO2: 98%  Weight: 168 lb 15.7 oz (76.6 kg)  Height: _0  (1.676 m)   98% on RA BMI Readings from Last 3 Encounters:  05/12/19 27.27 kg/m  01/22/19 26.79 kg/m  11/04/18 27.12 kg/m   Wt Readings from Last 3 Encounters:  05/12/19 168 lb 15.7 oz (76.6 kg)  01/22/19 166 lb (75.3 kg)  11/04/18 168 lb (76.2 kg)     CBC    Component Value Date/Time   WBC 4.3 07/20/2009 1335   RBC 3.23 (L) 07/20/2009 1335   HGB 9.1 (L) 07/20/2009 1335   HCT 27.6 (L) 07/20/2009 1335   PLT 274 07/20/2009 1335   MCV 85.3 07/20/2009 1335   MCHC 33.0 07/20/2009 1335   RDW 15.9 (H) 07/20/2009 1335   LYMPHSABS 1.6 07/18/2009 1744   MONOABS 0.3 07/18/2009 1744   EOSABS 0.1 07/18/2009 1744   BASOSABS 0.0 07/18/2009 1744    Outside BMP from 03/2019 reviewed: BUN 15 Cr 0.76 eGFR 75  Chest Imaging: Chest CT, noncontrast- anterior mediastinal mass between aortic arch & sternum. Subdiaphragmatic splenic mass. Stable pleural-based  nodule.  Abdominal MRI- enhancing splenic mass, 11.6 x 9.6 x 11.2 cm. Hepatic cysts. No abdominal adenopathy.      Assessment & Plan:     ICD-10-CM   1. Mediastinal mass  J98.59 NM PET Image Initial (PI) Skull Base To Thigh  2. Pulmonary nodule  R91.1   3. 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 Unlikely to be thyroid tissue as it was not present on previous scans,a although she has a history of goiter. -PET/CT scan; if unable to be performed, will refer for mediastinoscopy -follow up in clinic following PET  Right lung nodule, stable after 2 years -no additional workup required  Enlarging splenic mass -PET -follow up with Gastroenterologist, Dr. Oletta Lamas.    Current Outpatient Medications:    acetaminophen (TYLENOL) 325 MG tablet, Take 650 mg by mouth every 8 (eight) hours as needed., Disp: , Rfl:    cephALEXin (KEFLEX) 500 MG capsule, Take 1 capsule (500 mg total) by mouth 2 (two) times daily., Disp: 10 capsule, Rfl: 0   irbesartan (AVAPRO) 300 MG tablet, Take 300 mg by mouth daily., Disp: , Rfl:    latanoprost (XALATAN) 0.005 % ophthalmic solution, Place 1 drop into both eyes at bedtime., Disp: , Rfl:    Multiple Vitamin (MULTIVITAMIN) tablet, Take 1 tablet by mouth daily., Disp: , Rfl:    NATURE-THROID 81.25 MG TABS, Take 1  tablet by mouth daily., Disp: , Rfl:    pantoprazole (PROTONIX) 40 MG tablet, Take 40 mg by mouth 2 (two) times daily., Disp: , Rfl:    verapamil (CALAN-SR) 240 MG CR tablet, Take 240 mg by mouth daily., Disp: , Rfl:    Vitamin D, Ergocalciferol, (DRISDOL) 1.25 MG (50000 UT) CAPS capsule, Take 50,000 Units by mouth once a week., Disp: , Rfl:    Julian Hy, DO [facility] 05/12/2019 11:27 AM

## 2019-05-19 ENCOUNTER — Other Ambulatory Visit: Payer: Self-pay

## 2019-05-19 ENCOUNTER — Encounter (HOSPITAL_COMMUNITY)
Admission: RE | Admit: 2019-05-19 | Discharge: 2019-05-19 | Disposition: A | Discharge status: Home / Self Care | Payer: Medicare Other | Source: Ambulatory Visit | Attending: Critical Care Medicine | Admitting: Critical Care Medicine

## 2019-05-19 DIAGNOSIS — I10 Essential (primary) hypertension: Secondary | ICD-10-CM | POA: Diagnosis not present

## 2019-05-19 DIAGNOSIS — E039 Hypothyroidism, unspecified: Secondary | ICD-10-CM | POA: Diagnosis not present

## 2019-05-19 DIAGNOSIS — R161 Splenomegaly, not elsewhere classified: Secondary | ICD-10-CM | POA: Diagnosis present

## 2019-05-19 DIAGNOSIS — J9859 Other diseases of mediastinum, not elsewhere classified: Secondary | ICD-10-CM | POA: Insufficient documentation

## 2019-05-19 DIAGNOSIS — K219 Gastro-esophageal reflux disease without esophagitis: Secondary | ICD-10-CM | POA: Diagnosis not present

## 2019-05-19 DIAGNOSIS — Z79899 Other long term (current) drug therapy: Secondary | ICD-10-CM | POA: Diagnosis not present

## 2019-05-19 DIAGNOSIS — I700 Atherosclerosis of aorta: Secondary | ICD-10-CM | POA: Diagnosis not present

## 2019-05-19 LAB — GLUCOSE, CAPILLARY: Glucose-Capillary: 80 mg/dL (ref 70–99)

## 2019-05-19 MED ORDER — FLUDEOXYGLUCOSE F - 18 (FDG) INJECTION
8.6000 | Freq: Once | INTRAVENOUS | Status: AC | PRN
  Administered 2019-05-19: 8.6 via INTRAVENOUS

## 2019-05-23 ENCOUNTER — Ambulatory Visit: Payer: Medicare Other | Admitting: Critical Care Medicine

## 2019-05-23 ENCOUNTER — Other Ambulatory Visit: Payer: Self-pay

## 2019-05-23 ENCOUNTER — Encounter: Payer: Self-pay | Admitting: Critical Care Medicine

## 2019-05-23 VITALS — BP 126/88 | HR 77 | Temp 98.4°F | Ht 66.0 in | Wt 168.4 lb

## 2019-05-23 DIAGNOSIS — J9859 Other diseases of mediastinum, not elsewhere classified: Secondary | ICD-10-CM | POA: Diagnosis not present

## 2019-05-23 DIAGNOSIS — R911 Solitary pulmonary nodule: Secondary | ICD-10-CM | POA: Diagnosis not present

## 2019-05-23 NOTE — Progress Notes (Signed)
Synopsis: Referred in August 2020 for abnormal chest CT by Lawerance Cruel, MD  Subjective:   PATIENT ID: Miranda Russell GENDER: female DOB: 11-Sep-1946, MRN: [id number]  Chief Complaint  Patient presents with  . Follow-up    F/U after PET scan. States she has noticed that she becomes SOB when bending over to pick up stuff such as laundry.     Miranda Russell is a 73 year old woman with a history of multiple benign tumors that have been excised, GERD, splenic mass, and an anterior mediastinal mass discovered incidentally on a CT that returns for follow-up following a PET scan to better evaluate the anterior mediastinal mass.  She has no new symptoms to report since she was last seen last week.  OV 05/12/19: Miranda Russell is a 73 y/o woman referred for evaluation of an abnormal chest CT scan. Two years ago she had a previous CT scan to evaluate pain on the right side of her abdomen. At that time a small pleural based nodule was discovered. She continues to have the same right- sided pain, but it only is bothersome when she attempts to sleep on her right side. This follow up CT scan to reevaluate her nodule demonstrated a new anterior mediastinal mass. She denies chest pain, dysphagia, and SOB at rest, adenopathy, cough, or sputum production. Since her episodes of severe epistaxis last spring, she complains of DOE with having less endurance than before.  She has chronic anterior midline upper abdominal fullness.   She has a history of a known splenic mass that is followed by her GI physician, Dr. Oletta Lamas with [facility], in addition to her PUD and GERD. She has an outpatient EGD coming up. She has a history of multiple benign tumors- she has undergone breast surgeries for fibroid masses, a hysterectomy for a tumor, and underwent a thyroidectomy for a goiter, in late 1990s. She has no personal history of malignancy, but had multiple secondary family members (several of her mother's 15 siblings) with cancers.  She is not aware of a family history of lymphoma. She is a never smoker.    Past Medical History:  Diagnosis Date  . Colon polyp   . Epistaxis   . Fibroids   . GERD (gastroesophageal reflux disease)   . Hypertension   . Hypothyroidism    had thyroid removed  . Murmur   . Peptic ulcer disease   . Splenic mass      Family History  Problem Relation Age of Onset  . Heart failure Mother   . COPD Father   . Dementia Father   . COPD Sister   . Lung cancer Brother   . COPD Brother   . Lung cancer Maternal Aunt   . Heart failure Maternal Aunt   . Coronary artery disease Maternal Grandfather   . Breast cancer Neg Hx      Past Surgical History:  Procedure Laterality Date  . ABDOMINAL HYSTERECTOMY    . BREAST SURGERY    . CHOLECYSTECTOMY    . KNEE SURGERY    . THYROIDECTOMY      Social History   Socioeconomic History  . Marital status: Married    Spouse name: Not on file  . Number of children: Not on file  . Years of education: Not on file  . Highest education level: Not on file  Occupational History  . Not on file  Social Needs  . Financial resource strain: Not on file  . Food insecurity  Worry: Not on file    Inability: Not on file  . Transportation needs    Medical: Not on file    Non-medical: Not on file  Tobacco Use  . Smoking status: Never Smoker  . Smokeless tobacco: Never Used  Substance and Sexual Activity  . Alcohol use: Yes    Comment: occ  . Drug use: Never  . Sexual activity: Not on file  Lifestyle  . Physical activity    Days per week: Not on file    Minutes per session: Not on file  . Stress: Not on file  Relationships  . Social Herbalist on phone: Not on file    Gets together: Not on file    Attends religious service: Not on file    Active member of club or organization: Not on file    Attends meetings of clubs or organizations: Not on file    Relationship status: Not on file  . Intimate partner violence    Fear of  current or ex partner: Not on file    Emotionally abused: Not on file    Physically abused: Not on file    Forced sexual activity: Not on file  Other Topics Concern  . Not on file  Social History Narrative  . Not on file     Allergies  Allergen Reactions  . Morphine And Related Itching    PATIENT  . Codeine     PATIENT  . Keppra [Levetiracetam] Hives and Swelling     Outpatient Medications Prior to Visit  Medication Sig Dispense Refill  . acetaminophen (TYLENOL) 325 MG tablet Take 650 mg by mouth every 8 (eight) hours as needed.    . irbesartan (AVAPRO) 300 MG tablet Take 300 mg by mouth daily.    Marland Kitchen latanoprost (XALATAN) 0.005 % ophthalmic solution Place 1 drop into both eyes at bedtime.    . Multiple Vitamin (MULTIVITAMIN) tablet Take 1 tablet by mouth daily.    Marland Kitchen NATURE-THROID 81.25 MG TABS Take 1 tablet by mouth daily.    . pantoprazole (PROTONIX) 40 MG tablet Take 40 mg by mouth 2 (two) times daily.    . verapamil (CALAN-SR) 240 MG CR tablet Take 120 mg by mouth daily. Take 1/2 tablet once a day    . Vitamin D, Ergocalciferol, (DRISDOL) 1.25 MG (50000 UT) CAPS capsule Take 50,000 Units by mouth once a week.    . cephALEXin (KEFLEX) 500 MG capsule Take 1 capsule (500 mg total) by mouth 2 (two) times daily. 10 capsule 0   No facility-administered medications prior to visit.     Review of Systems  Constitutional: Negative for chills, fever and weight loss.  HENT: Negative.   Eyes: Negative.   Respiratory: Negative for cough, hemoptysis and shortness of breath.   Cardiovascular: Negative for chest pain and palpitations.  Gastrointestinal: Negative for nausea and vomiting.  Genitourinary: Negative.   Musculoskeletal: Negative.   Skin: Negative.   Neurological: Negative.      Objective:  Physical Exam Vitals signs reviewed.  Constitutional:      General: She is not in acute distress.    Appearance: Normal appearance. She is not diaphoretic.  HENT:     Head:  Normocephalic and atraumatic.     Nose:     Comments: Deferred due to masking requirement    Mouth/Throat:     Comments: Deferred due to masking requirement Neck:     Musculoskeletal: Neck supple.  Cardiovascular:  Rate and Rhythm: Normal rate and regular rhythm.     Heart sounds: No murmur.  Pulmonary:     Effort: Pulmonary effort is normal. No respiratory distress.     Breath sounds: Normal breath sounds.     Comments: Breathing comfortably on RA with normal speech Abdominal:     General: There is no distension.     Palpations: Abdomen is soft.  Musculoskeletal:        General: No swelling.  Lymphadenopathy:     Cervical: No cervical adenopathy.  Skin:    General: Skin is warm and dry.     Findings: No rash.  Neurological:     General: No focal deficit present.     Mental Status: She is alert.  Psychiatric:        Mood and Affect: Mood normal.        Behavior: Behavior normal.      Vitals:   05/23/19 1022  BP: 126/88  Pulse: 77  Temp: 98.4 F (36.9 C)  TempSrc: Oral  SpO2: 97%  Weight: 168 lb 6.4 oz (76.4 kg)  Height: 5\' 6"  (1.676 m)   97% on RA BMI Readings from Last 3 Encounters:  05/23/19 27.18 kg/m  05/12/19 27.27 kg/m  01/22/19 26.79 kg/m   Wt Readings from Last 3 Encounters:  05/23/19 168 lb 6.4 oz (76.4 kg)  05/12/19 168 lb 15.7 oz (76.6 kg)  01/22/19 166 lb (75.3 kg)     CBC    Component Value Date/Time   WBC 4.3 07/20/2009 1335   RBC 3.23 (L) 07/20/2009 1335   HGB 9.1 (L) 07/20/2009 1335   HCT 27.6 (L) 07/20/2009 1335   PLT 274 07/20/2009 1335   MCV 85.3 07/20/2009 1335   MCHC 33.0 07/20/2009 1335   RDW 15.9 (H) 07/20/2009 1335   LYMPHSABS 1.6 07/18/2009 1744   MONOABS 0.3 07/18/2009 1744   EOSABS 0.1 07/18/2009 1744   BASOSABS 0.0 07/18/2009 1744    Chest Imaging- images reviewed: NM/ CT PET 05/19/2019- No increased uptake in the anterior mediastinal mass, the low-attenuation lesion in the dome of the liver, or in the  spleen.  Otherwise physiologic uptake throughout.  Lungs unchanged.  Chest CT, noncontrast 04/07/2019- anterior mediastinal mass between aortic arch & sternum during 28 mm on slide 14 of series 2. Subdiaphragmatic splenic mass. Stable pleural-based nodule.  Chest CT with contrast 05/15/2016- upon reinspection, the anterior mediastinal mass may have been present, although it was difficult to differentiate from the innominate vein given its enhancement.  Measuring 25 mm on image 41 series 2.     Assessment & Plan:     ICD-10-CM   1. Mediastinal mass  J98.59 CT Chest W Contrast  2. Pulmonary nodule  R91.1    Anterior mediastinal mass-now suspicious that this could be residual thyroid tissue from her previous resection, which would explain why it does not track into the neck.  The slight enlargement since 2017 could be physiologic in this case.  It remains possible that this could be a thymoma, but without FDG enhancement, it is unlikely to be pathological adenopathy. -Will follow-up CT chest with contrast in 6 months to ensure it is not continuing to enlarge.  Right lung nodule, stable after 2 years.  Not PET avid. -no additional workup required  Follow-up in clinic after CT is performed in about 6 months.   Current Outpatient Medications:  .  acetaminophen (TYLENOL) 325 MG tablet, Take 650 mg by mouth every 8 (eight) hours  as needed., Disp: , Rfl:  .  irbesartan (AVAPRO) 300 MG tablet, Take 300 mg by mouth daily., Disp: , Rfl:  .  latanoprost (XALATAN) 0.005 % ophthalmic solution, Place 1 drop into both eyes at bedtime., Disp: , Rfl:  .  Multiple Vitamin (MULTIVITAMIN) tablet, Take 1 tablet by mouth daily., Disp: , Rfl:  .  NATURE-THROID 81.25 MG TABS, Take 1 tablet by mouth daily., Disp: , Rfl:  .  pantoprazole (PROTONIX) 40 MG tablet, Take 40 mg by mouth 2 (two) times daily., Disp: , Rfl:  .  verapamil (CALAN-SR) 240 MG CR tablet, Take 120 mg by mouth daily. Take 1/2 tablet once a day,  Disp: , Rfl:  .  Vitamin D, Ergocalciferol, (DRISDOL) 1.25 MG (50000 UT) CAPS capsule, Take 50,000 Units by mouth once a week., Disp: , Rfl:    Julian Hy, DO [facility] 05/23/2019 10:52 AM

## 2019-05-23 NOTE — Patient Instructions (Addendum)
Thank you for visiting Dr. Carlis Abbott at [facility]. We recommend the following: Orders Placed This Encounter  Procedures  . CT Chest W Contrast   Orders Placed This Encounter  Procedures  . CT Chest W Contrast    Standing Status:   Future    Standing Expiration Date:   05/22/2020    Order Specific Question:   ** REASON FOR EXAM (FREE TEXT)    Answer:   anterior mediastinal mass, previously not PET avid    Order Specific Question:   If indicated for the ordered procedure, I authorize the administration of contrast media per Radiology protocol    Answer:   Yes    Order Specific Question:   Preferred imaging location?    Answer:   [facility]    Order Specific Question:   Radiology Contrast Protocol - do NOT remove file path    Answer:   [email or web address]      Return in about 6 months (around 11/23/2019).    Please do your part to reduce the spread of COVID-19.

## 2019-10-14 ENCOUNTER — Other Ambulatory Visit: Payer: Self-pay

## 2019-10-14 DIAGNOSIS — J9859 Other diseases of mediastinum, not elsewhere classified: Secondary | ICD-10-CM

## 2019-10-23 ENCOUNTER — Telehealth: Payer: Self-pay | Admitting: Critical Care Medicine

## 2019-10-23 ENCOUNTER — Other Ambulatory Visit: Payer: Self-pay | Admitting: Critical Care Medicine

## 2019-10-23 DIAGNOSIS — R911 Solitary pulmonary nodule: Secondary | ICD-10-CM

## 2019-10-23 NOTE — Telephone Encounter (Signed)
Called and spoke with the pt  The lab she was at could not see the order  I tried entering as "lab collect" and they still could not see it so it has been faxed to them at [phone]

## 2019-10-24 LAB — BASIC METABOLIC PANEL WITH GFR
BUN: 13 mg/dL (ref 7–25)
CO2: 31 mmol/L (ref 20–32)
Calcium: 8.6 mg/dL (ref 8.6–10.4)
Chloride: 105 mmol/L (ref 98–110)
Creat: 0.71 mg/dL (ref 0.60–0.93)
GFR, Est African American: 99 mL/min/{1.73_m2} (ref >=60)
GFR, Est Non African American: 85 mL/min/{1.73_m2} (ref >=60)
Glucose, Bld: 92 mg/dL (ref 65–99)
Potassium: 4 mmol/L (ref 3.5–5.3)
Sodium: 140 mmol/L (ref 135–146)

## 2019-10-28 ENCOUNTER — Other Ambulatory Visit (HOSPITAL_BASED_OUTPATIENT_CLINIC_OR_DEPARTMENT_OTHER): Payer: Self-pay | Admitting: Family Medicine

## 2019-10-28 DIAGNOSIS — M25522 Pain in left elbow: Secondary | ICD-10-CM

## 2019-10-28 DIAGNOSIS — M25512 Pain in left shoulder: Secondary | ICD-10-CM

## 2019-10-29 ENCOUNTER — Other Ambulatory Visit: Payer: Self-pay

## 2019-10-29 ENCOUNTER — Ambulatory Visit (HOSPITAL_BASED_OUTPATIENT_CLINIC_OR_DEPARTMENT_OTHER)
Admission: RE | Admit: 2019-10-29 | Discharge: 2019-10-29 | Disposition: A | Discharge status: Home / Self Care | Payer: Medicare Other | Source: Ambulatory Visit | Attending: Family Medicine | Admitting: Family Medicine

## 2019-10-29 DIAGNOSIS — M25512 Pain in left shoulder: Secondary | ICD-10-CM

## 2019-10-29 DIAGNOSIS — M25522 Pain in left elbow: Secondary | ICD-10-CM

## 2019-11-18 ENCOUNTER — Other Ambulatory Visit: Payer: Self-pay | Admitting: General Surgery

## 2019-11-22 ENCOUNTER — Other Ambulatory Visit (HOSPITAL_COMMUNITY)
Admission: RE | Admit: 2019-11-22 | Discharge: 2019-11-22 | Disposition: A | Discharge status: Home / Self Care | Payer: Medicare Other | Source: Ambulatory Visit | Attending: General Surgery | Admitting: General Surgery

## 2019-11-22 DIAGNOSIS — Z01812 Encounter for preprocedural laboratory examination: Secondary | ICD-10-CM | POA: Insufficient documentation

## 2019-11-22 DIAGNOSIS — Z20822 Contact with and (suspected) exposure to covid-19: Secondary | ICD-10-CM | POA: Diagnosis not present

## 2019-11-22 LAB — SARS CORONAVIRUS 2 (TAT 6-24 HRS): SARS Coronavirus 2: NEGATIVE

## 2019-11-24 ENCOUNTER — Other Ambulatory Visit: Payer: Medicare Other

## 2019-11-25 ENCOUNTER — Other Ambulatory Visit: Payer: Self-pay

## 2019-11-25 ENCOUNTER — Encounter (HOSPITAL_COMMUNITY): Payer: Self-pay | Admitting: General Surgery

## 2019-11-25 NOTE — Progress Notes (Signed)
Pt denies SOB, chest pain, and being under the care of a cardiologist. Pt stated that PCP is Dr. Lona Kettle. Pt denies having a stress test and cardiac cath. Pt denies having an EKG and chest x ray in the last year. Pt denies recent labs. Pt made aware to stop taking Aspirin (unless otherwise advised by surgeon), vitamins, fish oil and herbal medications. Do not take any NSAIDs ie: Ibuprofen, Advil, Naproxen (Aleve), Motrin, BC and Goody Powder. Pt made aware to quarantine. Pt verbalized understanding of all pre-op instructions.

## 2019-11-26 ENCOUNTER — Encounter (HOSPITAL_COMMUNITY)
Admission: RE | Disposition: A | Discharge status: Home / Self Care | Payer: Self-pay | Source: Home / Self Care | Attending: General Surgery

## 2019-11-26 ENCOUNTER — Inpatient Hospital Stay (HOSPITAL_COMMUNITY)
Admission: RE | Admit: 2019-11-26 | Discharge: 2019-11-29 | DRG: 264 | Disposition: A | Discharge status: Home / Self Care | Payer: Medicare Other | Attending: General Surgery | Admitting: General Surgery

## 2019-11-26 ENCOUNTER — Inpatient Hospital Stay (HOSPITAL_COMMUNITY): Payer: Medicare Other | Admitting: Certified Registered Nurse Anesthetist

## 2019-11-26 ENCOUNTER — Other Ambulatory Visit: Payer: Self-pay

## 2019-11-26 ENCOUNTER — Encounter (HOSPITAL_COMMUNITY): Payer: Self-pay | Admitting: General Surgery

## 2019-11-26 DIAGNOSIS — Z888 Allergy status to other drugs, medicaments and biological substances status: Secondary | ICD-10-CM | POA: Diagnosis not present

## 2019-11-26 DIAGNOSIS — I728 Aneurysm of other specified arteries: Secondary | ICD-10-CM | POA: Diagnosis present

## 2019-11-26 DIAGNOSIS — M1990 Unspecified osteoarthritis, unspecified site: Secondary | ICD-10-CM | POA: Diagnosis present

## 2019-11-26 DIAGNOSIS — R161 Splenomegaly, not elsewhere classified: Secondary | ICD-10-CM | POA: Diagnosis present

## 2019-11-26 DIAGNOSIS — Z23 Encounter for immunization: Secondary | ICD-10-CM | POA: Diagnosis present

## 2019-11-26 DIAGNOSIS — Z885 Allergy status to narcotic agent status: Secondary | ICD-10-CM

## 2019-11-26 DIAGNOSIS — Z79899 Other long term (current) drug therapy: Secondary | ICD-10-CM | POA: Diagnosis not present

## 2019-11-26 DIAGNOSIS — I700 Atherosclerosis of aorta: Secondary | ICD-10-CM | POA: Diagnosis present

## 2019-11-26 DIAGNOSIS — K219 Gastro-esophageal reflux disease without esophagitis: Secondary | ICD-10-CM | POA: Diagnosis present

## 2019-11-26 DIAGNOSIS — K7689 Other specified diseases of liver: Secondary | ICD-10-CM | POA: Diagnosis present

## 2019-11-26 DIAGNOSIS — R011 Cardiac murmur, unspecified: Secondary | ICD-10-CM | POA: Diagnosis present

## 2019-11-26 DIAGNOSIS — Z79890 Hormone replacement therapy (postmenopausal): Secondary | ICD-10-CM

## 2019-11-26 HISTORY — DX: Other specified postprocedural states: Z98.890

## 2019-11-26 HISTORY — DX: Unspecified osteoarthritis, unspecified site: M19.90

## 2019-11-26 HISTORY — DX: Presence of spectacles and contact lenses: Z97.3

## 2019-11-26 HISTORY — DX: Unspecified glaucoma: H40.9

## 2019-11-26 HISTORY — DX: Nausea with vomiting, unspecified: R11.2

## 2019-11-26 HISTORY — PX: LAPAROSCOPIC SPLENECTOMY: SHX409

## 2019-11-26 LAB — COMPREHENSIVE METABOLIC PANEL
ALT: 16 U/L (ref 0–44)
AST: 21 U/L (ref 15–41)
Albumin: 4.2 g/dL (ref 3.5–5.0)
Alkaline Phosphatase: 48 U/L (ref 38–126)
Anion gap: 11 (ref 5–15)
BUN: 9 mg/dL (ref 8–23)
CO2: 24 mmol/L (ref 22–32)
Calcium: 8.7 mg/dL — ABNORMAL LOW (ref 8.9–10.3)
Chloride: 105 mmol/L (ref 98–111)
Creatinine, Ser: 0.64 mg/dL (ref 0.44–1.00)
GFR calc Af Amer: >60 mL/min (ref >60)
GFR calc non Af Amer: >60 mL/min (ref >60)
Glucose, Bld: 70 mg/dL (ref 70–99)
Potassium: 3.7 mmol/L (ref 3.5–5.1)
Sodium: 140 mmol/L (ref 135–145)
Total Bilirubin: 0.9 mg/dL (ref 0.3–1.2)
Total Protein: 7 g/dL (ref 6.5–8.1)

## 2019-11-26 LAB — CBC WITH DIFFERENTIAL/PLATELET
Abs Immature Granulocytes: 0.01 10*3/uL (ref 0.00–0.07)
Basophils Absolute: 0 10*3/uL (ref 0.0–0.1)
Basophils Relative: 1 %
Eosinophils Absolute: 0.1 10*3/uL (ref 0.0–0.5)
Eosinophils Relative: 3 %
HCT: 40.5 % (ref 36.0–46.0)
Hemoglobin: 12.9 g/dL (ref 12.0–15.0)
Immature Granulocytes: 0 %
Lymphocytes Relative: 35 %
Lymphs Abs: 1.6 10*3/uL (ref 0.7–4.0)
MCH: 28.7 pg (ref 26.0–34.0)
MCHC: 31.9 g/dL (ref 30.0–36.0)
MCV: 90.2 fL (ref 80.0–100.0)
Monocytes Absolute: 0.4 10*3/uL (ref 0.1–1.0)
Monocytes Relative: 8 %
Neutro Abs: 2.4 10*3/uL (ref 1.7–7.7)
Neutrophils Relative %: 53 %
Platelets: 185 10*3/uL (ref 150–400)
RBC: 4.49 MIL/uL (ref 3.87–5.11)
RDW: 14.1 % (ref 11.5–15.5)
WBC: 4.5 10*3/uL (ref 4.0–10.5)
nRBC: 0 % (ref 0.0–0.2)

## 2019-11-26 LAB — CBC
HCT: 37.5 % (ref 36.0–46.0)
Hemoglobin: 12.1 g/dL (ref 12.0–15.0)
MCH: 28.9 pg (ref 26.0–34.0)
MCHC: 32.3 g/dL (ref 30.0–36.0)
MCV: 89.7 fL (ref 80.0–100.0)
Platelets: 188 10*3/uL (ref 150–400)
RBC: 4.18 MIL/uL (ref 3.87–5.11)
RDW: 14.2 % (ref 11.5–15.5)
WBC: 10.8 10*3/uL — ABNORMAL HIGH (ref 4.0–10.5)
nRBC: 0 % (ref 0.0–0.2)

## 2019-11-26 LAB — CREATININE, SERUM
Creatinine, Ser: 0.7 mg/dL (ref 0.44–1.00)
GFR calc Af Amer: >60 mL/min (ref >60)
GFR calc non Af Amer: >60 mL/min (ref >60)

## 2019-11-26 LAB — PROTIME-INR
INR: 1.1 (ref 0.8–1.2)
Prothrombin Time: 14.3 seconds (ref 11.4–15.2)

## 2019-11-26 LAB — ABO/RH: ABO/RH(D): O POS

## 2019-11-26 LAB — TYPE AND SCREEN
ABO/RH(D): O POS
Antibody Screen: NEGATIVE

## 2019-11-26 LAB — AMYLASE: Amylase: 113 U/L — ABNORMAL HIGH (ref 28–100)

## 2019-11-26 SURGERY — SPLENECTOMY, LAPAROSCOPIC
Anesthesia: General | Site: Abdomen

## 2019-11-26 MED ORDER — LACTATED RINGERS IV SOLN: INTRAVENOUS | Status: DC | PRN

## 2019-11-26 MED ORDER — MEPERIDINE HCL 25 MG/ML IJ SOLN: 6.2500 mg | INTRAMUSCULAR | Status: DC | PRN

## 2019-11-26 MED ORDER — 0.9 % SODIUM CHLORIDE (POUR BTL) OPTIME
TOPICAL | Status: DC | PRN
  Administered 2019-11-26: 15:00:00 1000 mL

## 2019-11-26 MED ORDER — GABAPENTIN 100 MG PO CAPS
ORAL_CAPSULE | ORAL | Status: AC
  Administered 2019-11-26: 100 mg via ORAL
  Filled 2019-11-26: qty 1

## 2019-11-26 MED ORDER — ENSURE PRE-SURGERY PO LIQD: 296.0000 mL | Freq: Once | ORAL | Status: DC

## 2019-11-26 MED ORDER — METHOCARBAMOL 1000 MG/10ML IJ SOLN
500.0000 mg | Freq: Three times a day (TID) | INTRAVENOUS | Status: DC | PRN
  Filled 2019-11-26 (×2): qty 5

## 2019-11-26 MED ORDER — MIDAZOLAM HCL 2 MG/2ML IJ SOLN: 0.5000 mg | Freq: Once | INTRAMUSCULAR | Status: DC

## 2019-11-26 MED ORDER — CHLORHEXIDINE GLUCONATE CLOTH 2 % EX PADS: 6.0000 | MEDICATED_PAD | Freq: Once | CUTANEOUS | Status: DC

## 2019-11-26 MED ORDER — ONDANSETRON HCL 4 MG/2ML IJ SOLN
INTRAMUSCULAR | Status: AC
  Filled 2019-11-26: qty 2

## 2019-11-26 MED ORDER — KCL IN DEXTROSE-NACL 20-5-0.45 MEQ/L-%-% IV SOLN
INTRAVENOUS | Status: DC
  Filled 2019-11-26 (×4): qty 1000

## 2019-11-26 MED ORDER — IRBESARTAN 300 MG PO TABS
300.0000 mg | ORAL_TABLET | Freq: Every day | ORAL | Status: DC
  Administered 2019-11-27 – 2019-11-29 (×3): 300 mg via ORAL
  Filled 2019-11-26 (×3): qty 1

## 2019-11-26 MED ORDER — PROPOFOL 10 MG/ML IV BOLUS
INTRAVENOUS | Status: DC | PRN
  Administered 2019-11-26: 80 mg via INTRAVENOUS

## 2019-11-26 MED ORDER — MIDAZOLAM HCL 2 MG/2ML IJ SOLN
INTRAMUSCULAR | Status: AC
  Administered 2019-11-26: 2 mg via INTRAVENOUS
  Filled 2019-11-26: qty 2

## 2019-11-26 MED ORDER — ROCURONIUM BROMIDE 10 MG/ML (PF) SYRINGE
PREFILLED_SYRINGE | INTRAVENOUS | Status: AC
  Filled 2019-11-26: qty 10

## 2019-11-26 MED ORDER — ROCURONIUM BROMIDE 10 MG/ML (PF) SYRINGE
PREFILLED_SYRINGE | INTRAVENOUS | Status: DC | PRN
  Administered 2019-11-26: 60 mg via INTRAVENOUS
  Administered 2019-11-26: 40 mg via INTRAVENOUS

## 2019-11-26 MED ORDER — FENTANYL CITRATE (PF) 100 MCG/2ML IJ SOLN
INTRAMUSCULAR | Status: AC
  Administered 2019-11-26: 100 ug via INTRAVENOUS
  Filled 2019-11-26: qty 2

## 2019-11-26 MED ORDER — FENTANYL CITRATE (PF) 250 MCG/5ML IJ SOLN
INTRAMUSCULAR | Status: DC | PRN
  Administered 2019-11-26 (×2): 100 ug via INTRAVENOUS
  Administered 2019-11-26: 50 ug via INTRAVENOUS

## 2019-11-26 MED ORDER — ONDANSETRON HCL 4 MG/2ML IJ SOLN: 4.0000 mg | Freq: Four times a day (QID) | INTRAMUSCULAR | Status: DC | PRN

## 2019-11-26 MED ORDER — LIDOCAINE 2% (20 MG/ML) 5 ML SYRINGE
INTRAMUSCULAR | Status: DC | PRN
  Administered 2019-11-26: 60 mg via INTRAVENOUS

## 2019-11-26 MED ORDER — ONDANSETRON HCL 4 MG/2ML IJ SOLN
INTRAMUSCULAR | Status: DC | PRN
  Administered 2019-11-26: 4 mg via INTRAVENOUS

## 2019-11-26 MED ORDER — CEFAZOLIN SODIUM-DEXTROSE 2-4 GM/100ML-% IV SOLN
2.0000 g | Freq: Three times a day (TID) | INTRAVENOUS | Status: AC
  Administered 2019-11-26: 2 g via INTRAVENOUS
  Filled 2019-11-26: qty 100

## 2019-11-26 MED ORDER — OXYCODONE HCL 5 MG PO TABS
5.0000 mg | ORAL_TABLET | ORAL | Status: DC | PRN
  Administered 2019-11-26: 5 mg via ORAL
  Administered 2019-11-26 – 2019-11-28 (×3): 10 mg via ORAL
  Filled 2019-11-26 (×3): qty 2

## 2019-11-26 MED ORDER — FENTANYL CITRATE (PF) 250 MCG/5ML IJ SOLN
INTRAMUSCULAR | Status: AC
  Filled 2019-11-26: qty 5

## 2019-11-26 MED ORDER — METHOCARBAMOL 500 MG PO TABS
ORAL_TABLET | ORAL | Status: AC
  Filled 2019-11-26: qty 1

## 2019-11-26 MED ORDER — VERAPAMIL HCL ER 120 MG PO TBCR
120.0000 mg | EXTENDED_RELEASE_TABLET | Freq: Every day | ORAL | Status: DC
  Administered 2019-11-27 – 2019-11-29 (×3): 120 mg via ORAL
  Filled 2019-11-26 (×3): qty 1

## 2019-11-26 MED ORDER — LIDOCAINE 2% (20 MG/ML) 5 ML SYRINGE
INTRAMUSCULAR | Status: AC
  Filled 2019-11-26: qty 5

## 2019-11-26 MED ORDER — ENOXAPARIN SODIUM 40 MG/0.4ML ~~LOC~~ SOLN
40.0000 mg | SUBCUTANEOUS | Status: DC
  Administered 2019-11-27 – 2019-11-29 (×3): 40 mg via SUBCUTANEOUS
  Filled 2019-11-26 (×3): qty 0.4

## 2019-11-26 MED ORDER — DEXAMETHASONE SODIUM PHOSPHATE 10 MG/ML IJ SOLN
INTRAMUSCULAR | Status: AC
  Filled 2019-11-26: qty 1

## 2019-11-26 MED ORDER — PROMETHAZINE HCL 25 MG/ML IJ SOLN: 6.2500 mg | INTRAMUSCULAR | Status: DC | PRN

## 2019-11-26 MED ORDER — CELECOXIB 200 MG PO CAPS
200.0000 mg | ORAL_CAPSULE | Freq: Two times a day (BID) | ORAL | Status: DC
  Administered 2019-11-26 – 2019-11-29 (×6): 200 mg via ORAL
  Filled 2019-11-26 (×6): qty 1

## 2019-11-26 MED ORDER — ACETAMINOPHEN 500 MG PO TABS: 1000.0000 mg | ORAL_TABLET | ORAL | Status: AC

## 2019-11-26 MED ORDER — OXYCODONE HCL 5 MG PO TABS
ORAL_TABLET | ORAL | Status: AC
  Filled 2019-11-26: qty 1

## 2019-11-26 MED ORDER — PHENYLEPHRINE 40 MCG/ML (10ML) SYRINGE FOR IV PUSH (FOR BLOOD PRESSURE SUPPORT): PREFILLED_SYRINGE | INTRAVENOUS | Status: DC | PRN

## 2019-11-26 MED ORDER — LIDOCAINE-EPINEPHRINE 1 %-1:100000 IJ SOLN
INTRAMUSCULAR | Status: AC
  Filled 2019-11-26: qty 1

## 2019-11-26 MED ORDER — LATANOPROST 0.005 % OP SOLN
1.0000 [drp] | Freq: Every day | OPHTHALMIC | Status: DC
  Administered 2019-11-26 – 2019-11-28 (×3): 1 [drp] via OPHTHALMIC
  Filled 2019-11-26: qty 2.5

## 2019-11-26 MED ORDER — GABAPENTIN 100 MG PO CAPS: 100.0000 mg | ORAL_CAPSULE | ORAL | Status: AC

## 2019-11-26 MED ORDER — DIPHENHYDRAMINE HCL 12.5 MG/5ML PO ELIX: 12.5000 mg | ORAL_SOLUTION | Freq: Four times a day (QID) | ORAL | Status: DC | PRN

## 2019-11-26 MED ORDER — CEFAZOLIN SODIUM-DEXTROSE 2-4 GM/100ML-% IV SOLN
INTRAVENOUS | Status: AC
  Filled 2019-11-26: qty 100

## 2019-11-26 MED ORDER — LEVOTHYROXINE SODIUM 25 MCG PO TABS
137.0000 ug | ORAL_TABLET | Freq: Every day | ORAL | Status: DC
  Administered 2019-11-27 – 2019-11-29 (×3): 137 ug via ORAL
  Filled 2019-11-26 (×3): qty 1

## 2019-11-26 MED ORDER — DEXAMETHASONE SODIUM PHOSPHATE 10 MG/ML IJ SOLN
INTRAMUSCULAR | Status: DC | PRN
  Administered 2019-11-26: 5 mg via INTRAVENOUS

## 2019-11-26 MED ORDER — CEFAZOLIN SODIUM-DEXTROSE 2-4 GM/100ML-% IV SOLN
2.0000 g | INTRAVENOUS | Status: AC
  Administered 2019-11-26: 2 g via INTRAVENOUS

## 2019-11-26 MED ORDER — LIDOCAINE HCL 1 % IJ SOLN
INTRAMUSCULAR | Status: DC | PRN
  Administered 2019-11-26: 10 mL

## 2019-11-26 MED ORDER — MIDAZOLAM HCL 2 MG/2ML IJ SOLN
INTRAMUSCULAR | Status: DC | PRN
  Administered 2019-11-26: 2 mg via INTRAVENOUS

## 2019-11-26 MED ORDER — PROPOFOL 10 MG/ML IV BOLUS
INTRAVENOUS | Status: AC
  Filled 2019-11-26: qty 20

## 2019-11-26 MED ORDER — ACETAMINOPHEN 10 MG/ML IV SOLN: 1000.0000 mg | Freq: Once | INTRAVENOUS | Status: DC | PRN

## 2019-11-26 MED ORDER — ACETAMINOPHEN 500 MG PO TABS
ORAL_TABLET | ORAL | Status: AC
  Administered 2019-11-26: 1000 mg via ORAL
  Filled 2019-11-26: qty 2

## 2019-11-26 MED ORDER — ACETAMINOPHEN 500 MG PO TABS: 500.0000 mg | ORAL_TABLET | Freq: Four times a day (QID) | ORAL | Status: DC | PRN

## 2019-11-26 MED ORDER — MIDAZOLAM HCL 2 MG/2ML IJ SOLN: 2.0000 mg | Freq: Once | INTRAMUSCULAR | Status: AC

## 2019-11-26 MED ORDER — LACTATED RINGERS IV SOLN: INTRAVENOUS | Status: DC

## 2019-11-26 MED ORDER — ROPIVACAINE HCL 5 MG/ML IJ SOLN
INTRAMUSCULAR | Status: DC | PRN
  Administered 2019-11-26 (×2): 30 mL

## 2019-11-26 MED ORDER — SENNA 8.6 MG PO TABS
1.0000 | ORAL_TABLET | Freq: Two times a day (BID) | ORAL | Status: DC
  Administered 2019-11-26 – 2019-11-29 (×6): 8.6 mg via ORAL
  Filled 2019-11-26 (×6): qty 1

## 2019-11-26 MED ORDER — FENTANYL CITRATE (PF) 100 MCG/2ML IJ SOLN: 25.0000 ug | INTRAMUSCULAR | Status: DC | PRN

## 2019-11-26 MED ORDER — SUGAMMADEX SODIUM 200 MG/2ML IV SOLN
INTRAVENOUS | Status: DC | PRN
  Administered 2019-11-26: 200 mg via INTRAVENOUS

## 2019-11-26 MED ORDER — ENSURE PRE-SURGERY PO LIQD: 592.0000 mL | Freq: Once | ORAL | Status: DC

## 2019-11-26 MED ORDER — EPHEDRINE SULFATE-NACL 50-0.9 MG/10ML-% IV SOSY
PREFILLED_SYRINGE | INTRAVENOUS | Status: DC | PRN
  Administered 2019-11-26 (×2): 10 mg via INTRAVENOUS

## 2019-11-26 MED ORDER — TRAMADOL HCL 50 MG PO TABS
50.0000 mg | ORAL_TABLET | Freq: Four times a day (QID) | ORAL | Status: DC | PRN
  Administered 2019-11-27: 50 mg via ORAL
  Filled 2019-11-26: qty 1

## 2019-11-26 MED ORDER — MIDAZOLAM HCL 2 MG/2ML IJ SOLN
INTRAMUSCULAR | Status: AC
  Filled 2019-11-26: qty 2

## 2019-11-26 MED ORDER — FENTANYL CITRATE (PF) 100 MCG/2ML IJ SOLN: 12.5000 ug | INTRAMUSCULAR | Status: DC | PRN

## 2019-11-26 MED ORDER — DIPHENHYDRAMINE HCL 50 MG/ML IJ SOLN: 12.5000 mg | Freq: Four times a day (QID) | INTRAMUSCULAR | Status: DC | PRN

## 2019-11-26 MED ORDER — PANTOPRAZOLE SODIUM 40 MG PO TBEC
40.0000 mg | DELAYED_RELEASE_TABLET | Freq: Every day | ORAL | Status: DC
  Administered 2019-11-26 – 2019-11-29 (×4): 40 mg via ORAL
  Filled 2019-11-26 (×4): qty 1

## 2019-11-26 MED ORDER — ADULT MULTIVITAMIN W/MINERALS CH
1.0000 | ORAL_TABLET | Freq: Every day | ORAL | Status: DC
  Administered 2019-11-27 – 2019-11-28 (×2): 1 via ORAL
  Filled 2019-11-26 (×2): qty 1

## 2019-11-26 MED ORDER — PHENYLEPHRINE HCL-NACL 10-0.9 MG/250ML-% IV SOLN
INTRAVENOUS | Status: DC | PRN
  Administered 2019-11-26: 25 ug/min via INTRAVENOUS

## 2019-11-26 MED ORDER — METOPROLOL TARTRATE 5 MG/5ML IV SOLN: 5.0000 mg | Freq: Four times a day (QID) | INTRAVENOUS | Status: DC | PRN

## 2019-11-26 MED ORDER — CLONIDINE HCL (ANALGESIA) 100 MCG/ML EP SOLN
EPIDURAL | Status: DC | PRN
  Administered 2019-11-26 (×2): 100 ug

## 2019-11-26 MED ORDER — TIMOLOL MALEATE 0.5 % OP SOLN
1.0000 [drp] | Freq: Two times a day (BID) | OPHTHALMIC | Status: DC
  Administered 2019-11-26 – 2019-11-29 (×6): 1 [drp] via OPHTHALMIC
  Filled 2019-11-26: qty 5

## 2019-11-26 MED ORDER — ONDANSETRON 4 MG PO TBDP: 4.0000 mg | ORAL_TABLET | Freq: Four times a day (QID) | ORAL | Status: DC | PRN

## 2019-11-26 MED ORDER — BUPIVACAINE HCL (PF) 0.25 % IJ SOLN
INTRAMUSCULAR | Status: AC
  Filled 2019-11-26: qty 30

## 2019-11-26 MED ORDER — FENTANYL CITRATE (PF) 100 MCG/2ML IJ SOLN: 100.0000 ug | Freq: Once | INTRAMUSCULAR | Status: AC

## 2019-11-26 SURGICAL SUPPLY — 68 items
ADH SKN CLS APL DERMABOND .7 (GAUZE/BANDAGES/DRESSINGS) ×2
APL LAPSCP 35 DL APL RGD (MISCELLANEOUS)
APL PRP STRL LF DISP 70% ISPRP (MISCELLANEOUS) ×1
APPLICATOR VISTASEAL 35 (MISCELLANEOUS) ×1 IMPLANT
APPLIER CLIP 5 13 M/L LIGAMAX5 (MISCELLANEOUS) ×3
APR CLP MED LRG 5 ANG JAW (MISCELLANEOUS) ×1
BAG BILE T-TUBES STRL (MISCELLANEOUS) ×2 IMPLANT
BAG DRN 9.5 2 ADJ BELT ADPR (MISCELLANEOUS) ×1
BIOPATCH RED 1 DISK 7.0 (GAUZE/BANDAGES/DRESSINGS) ×1 IMPLANT
BIOPATCH RED 1IN DISK 7.0MM (GAUZE/BANDAGES/DRESSINGS) ×1
BLADE CLIPPER SURG (BLADE) IMPLANT
BLADE SURG 10 STRL SS (BLADE) ×2 IMPLANT
CANISTER SUCT 3000ML PPV (MISCELLANEOUS) IMPLANT
CHLORAPREP W/TINT 26 (MISCELLANEOUS) ×3 IMPLANT
CLIP APPLIE 5 13 M/L LIGAMAX5 (MISCELLANEOUS) IMPLANT
COVER SURGICAL LIGHT HANDLE (MISCELLANEOUS) ×3 IMPLANT
COVER WAND RF STERILE (DRAPES) ×3 IMPLANT
DERMABOND ADVANCED (GAUZE/BANDAGES/DRESSINGS) ×4
DERMABOND ADVANCED .7 DNX12 (GAUZE/BANDAGES/DRESSINGS) IMPLANT
DRAIN CHANNEL 19F RND (DRAIN) ×2 IMPLANT
DRAPE LAPAROSCOPIC ABDOMINAL (DRAPES) ×3 IMPLANT
DRAPE WARM FLUID 44X44 (DRAPES) ×3 IMPLANT
DRSG PAD ABDOMINAL 8X10 ST (GAUZE/BANDAGES/DRESSINGS) ×2 IMPLANT
DRSG TEGADERM 4X4.75 (GAUZE/BANDAGES/DRESSINGS) ×2 IMPLANT
ELECT BLADE 4.0 EZ CLEAN MEGAD (MISCELLANEOUS) ×3
ELECT REM PT RETURN 9FT ADLT (ELECTROSURGICAL) ×3
ELECTRODE BLDE 4.0 EZ CLN MEGD (MISCELLANEOUS) IMPLANT
ELECTRODE REM PT RTRN 9FT ADLT (ELECTROSURGICAL) ×1 IMPLANT
EVACUATOR SILICONE 100CC (DRAIN) IMPLANT
GAUZE SPONGE 4X4 12PLY STRL (GAUZE/BANDAGES/DRESSINGS) IMPLANT
GLOVE BIO SURGEON STRL SZ 6 (GLOVE) ×3 IMPLANT
GLOVE INDICATOR 6.5 STRL GRN (GLOVE) ×3 IMPLANT
GOWN STRL REUS W/ TWL LRG LVL3 (GOWN DISPOSABLE) ×3 IMPLANT
GOWN STRL REUS W/TWL 2XL LVL3 (GOWN DISPOSABLE) ×3 IMPLANT
GOWN STRL REUS W/TWL LRG LVL3 (GOWN DISPOSABLE) ×9
KIT BASIN OR (CUSTOM PROCEDURE TRAY) ×3 IMPLANT
KIT TURNOVER KIT B (KITS) ×3 IMPLANT
L-HOOK LAP DISP 36CM (ELECTROSURGICAL) ×3
LHOOK LAP DISP 36CM (ELECTROSURGICAL) ×1 IMPLANT
MARKER SKIN DUAL TIP RULER LAB (MISCELLANEOUS) ×2 IMPLANT
NS IRRIG 1000ML POUR BTL (IV SOLUTION) ×3 IMPLANT
PAD ARMBOARD 7.5X6 YLW CONV (MISCELLANEOUS) ×6 IMPLANT
PENCIL BUTTON HOLSTER BLD 10FT (ELECTRODE) ×3 IMPLANT
POUCH ENDO CATCH II 15MM (MISCELLANEOUS) ×4 IMPLANT
RELOAD STAPLE 60 2.6 WHT THN (STAPLE) ×4 IMPLANT
RELOAD STAPLER WHITE 60MM (STAPLE) ×2 IMPLANT
SET IRRIG TUBING LAPAROSCOPIC (IRRIGATION / IRRIGATOR) ×3 IMPLANT
SHEARS HARMONIC ACE PLUS 36CM (ENDOMECHANICALS) ×3 IMPLANT
SLEEVE ENDOPATH XCEL 5M (ENDOMECHANICALS) ×9 IMPLANT
SLEEVE SURGEON STRL (DRAPES) ×3 IMPLANT
STAPLE ECHEON FLEX 60 POW ENDO (STAPLE) ×3 IMPLANT
STAPLER RELOAD WHITE 60MM (STAPLE) ×6
SUT ETHILON 2 0 FS 18 (SUTURE) ×2 IMPLANT
SUT MNCRL AB 4-0 PS2 18 (SUTURE) ×3 IMPLANT
SUT PDS AB 1 TP1 54 (SUTURE) ×4 IMPLANT
SUT VIC AB 3-0 SH 8-18 (SUTURE) ×2 IMPLANT
SYS LAPSCP GELPORT 120MM (MISCELLANEOUS) ×3
SYSTEM LAPSCP GELPORT 120MM (MISCELLANEOUS) IMPLANT
TOWEL GREEN STERILE (TOWEL DISPOSABLE) ×3 IMPLANT
TOWEL GREEN STERILE FF (TOWEL DISPOSABLE) ×3 IMPLANT
TRAY FOLEY MTR SLVR 14FR STAT (SET/KITS/TRAYS/PACK) ×3 IMPLANT
TRAY LAPAROSCOPIC MC (CUSTOM PROCEDURE TRAY) ×3 IMPLANT
TROCAR BLADELESS 15MM (ENDOMECHANICALS) ×3 IMPLANT
TROCAR XCEL 12X100 BLDLESS (ENDOMECHANICALS) ×2 IMPLANT
TROCAR XCEL NON-BLD 11X100MML (ENDOMECHANICALS) ×2 IMPLANT
TROCAR XCEL NON-BLD 5MMX100MML (ENDOMECHANICALS) ×3 IMPLANT
TUBING EVAC SMOKE HEATED PNEUM (TUBING) ×3 IMPLANT
WATER STERILE IRR 1000ML POUR (IV SOLUTION) ×3 IMPLANT

## 2019-11-26 NOTE — Interval H&P Note (Signed)
History and Physical Interval Note:  11/26/2019 1:28 PM  Miranda Russell  has presented today for surgery, with the diagnosis of splenic mass.  The various methods of treatment have been discussed with the patient and family. After consideration of risks, benefits and other options for treatment, the patient has consented to  Procedure(s): LAPAROSCOPIC SPLENECTOMY (N/A) as a surgical intervention.  The patient's history has been reviewed, patient examined, no change in status, stable for surgery.  I have reviewed the patient's chart and labs.  Questions were answered to the patient's satisfaction.     Stark Klein

## 2019-11-26 NOTE — Progress Notes (Signed)
Patient admitted from PACU s/p laparoscopic splenectomy. Alert and oriented x4. Denies c/o pain. Lap sites clean,dry and intact. Percutaneous drain to LLQ intact. Oriented to room and remote.

## 2019-11-26 NOTE — Op Note (Signed)
PRE-OPERATIVE DIAGNOSIS: enlarging splenic mass  POST-OPERATIVE DIAGNOSIS:  Same  PROCEDURE:  Procedure(s): Hand assisted laparoscopic splenectomy and   SURGEON:  Surgeon(s): Stark Klein, MD  ASSISTANT: Ewell Poe, RNFA  ANESTHESIA:   local and general  DRAINS: none   LOCAL MEDICATIONS USED:  BUPIVICAINE  and LIDOCAINE   SPECIMEN:  Source of Specimen:  spleen  DISPOSITION OF SPECIMEN:  PATHOLOGY  COUNTS:  YES  DICTATION: .Dragon Dictation  PLAN OF CARE: Admit to inpatient   PATIENT DISPOSITION:  PACU - hemodynamically stable.  FINDINGS:  Large soft splenic mass and splenic artery aneurysm distally in splenic artery.    EBL: 300 mL  PROCEDURE:   Patient was identified in the holding area and taken to the operating room where she was placed supine on operating room table. General anesthesia was induced. Foley catheter was placed. She was placed into the leaning spleen position with appropriate padding.  Patient's abdomen was prepped and draped in sterile fashion.  A timeout was performed according to the surgical safety checklist. When all was correct, we continued.  Local anesthetic was administered in the left subcostal location.  A 5 mm Optiview trocar was placed under direct visualization.   Pneumoperitoneum was achieved to a pressure of 15 mmHg. Additional 5 mm trocars were placed in the upper midline, left mid abdomen, and left lower quadrant after administration of local anesthetic.  The lienocolic ligament was taken down with the harmonic.The short gastrics were taken down with the harmonic scalpel. The posterior and lateral attachments of the spleen were then taken down. The surface of the spleen started oozing in a manner that was not amenable to cautery or clipping.  Due to this and the presence of the mass, the hand port was placed in the upper midline. The skin incision was made with the #10 blade.  The cautery was used to divide the subcutaneous tissues and  the fascia.  The hand port was inserted.  Digital retraction was used to assist with the dissection.  The splenic vessels were identified.    There was a distal splenic artery aneurysm noted.  The pancreas was gently dissected away from the splenic pedicle.  The echelon stapler was used to divide the splenic vessels.  A 15 mm trocar was placed via the gel port.  The spleen was placed into the large bag.  It was then pulled out gently via the hand port incision.  The spleen had no evidence of rupture.  The left upper quadrant and the entire abdomen was then irrigated copiously.  A four quadrant inspection was performed showing no evidence of bleeding or bowel injury.    Additional local anesthetic was administered into the fascia and the skin of the hand port.  The midline hand port incision was then closed in with #1 running PDS suture. The skin of this incision was closed with interrupted 3-0 Vicryl deep dermal sutures and 4-0 Monocryl running subcuticular suture. The skin of the remaining trocar sites was closed with 4-0 Monocryl as well.  The soft sterile dressings were applied. The patient was allowed to emerge from anesthesia and was taken to the PACU in stable condition. Needle, sponge, and instrument counts were correct 2.

## 2019-11-26 NOTE — Plan of Care (Signed)
  Problem: Clinical Measurements: Goal: Postoperative complications will be avoided or minimized Outcome: Progressing   

## 2019-11-26 NOTE — H&P (Signed)
Miranda Russell Documented: 11/18/2019 9:01 AM Location: [facility] Patient #: [id number] DOB: 1946-07-19 Married / Language: Miranda Russell / Race: White Female   History of Present Illness Stark Klein MD; 11/18/2019 9:49 AM) The patient is a 74 year old female who presents with an abdominal mass. Pt is a lovely 74 yo F referred by Dr. Oletta Lamas for a diagnosis of splenic mass. The patient has had a history of abdominal pain for around 10 years. She has had gallbladder removal, upper endoscopy, been on PPIs, and has tried other medications like carafate. None of these measures have solved the problem. She continues to have occasional epigastric pain and some left and right abdominal pain when she lays on her side. The pain is moderate in intensity and goes away on its own when she changes position. She also complains of belching. She had an ulcer in the past, but recent endoscopy in the last 6 months was negative. She does have small hepatic cysts and has an 11 cm splenic mass.   Images and reports are reviewed.   PET scan 05/2019 IMPRESSION: 1. Prevascular soft tissue lesion does not show abnormal hypermetabolism. Consider follow-up CT chest with contrast in 3-6 months in further evaluation. 2. Splenic mass, better evaluated on 04/07/2019. 3. Aortic atherosclerosis (ICD10-170.0).  MRI 05/2019 IMPRESSION: 1. Slight progression in the large enhancing splenic mass since 08/30/2016. Imaging features are nonspecific, but given relatively modest increase in size over the more than 2 year interval, benign etiology (i.e. Hamartoma) is favored although lymphoma or sarcoma not entirely excluded. Continued close attention recommended. 2. Hepatic cysts.   Past Surgical History ([name], [place]; 11/18/2019 9:02 AM) Gallbladder Surgery - Laparoscopic   Diagnostic Studies History ([name], [place]; 11/18/2019 9:02 AM) Colonoscopy  1-5 years ago Mammogram  within last year Pap Smear   1-5 years ago  Allergies ([name], CMA; 11/18/2019 9:03 AM) Morphine Sulfate (Concentrate) *ANALGESICS - OPIOID*  Codeine and Related  Keppra *ANTICONVULSANTS*  Allergies Reconciled   Medication History ([name], CMA; 11/18/2019 9:05 AM) Irbesartan (300MG  Tablet, Oral) Active. Latanoprost (0.005% Solution, Ophthalmic) Active. Levothyroxine Sodium (137MCG Tablet, Oral) Active. Omeprazole (20MG  Capsule DR, Oral) Active. Timolol Maleate (0.5% Solution, Ophthalmic) Active. Verapamil HCl ER (240MG  Tablet ER, Oral) Active. Vitamin D (50000U Capsule, Oral) Active. Medications Reconciled  Social History ([name], CMA; 11/18/2019 9:02 AM) Alcohol use  Moderate alcohol use. Caffeine use  Coffee, Tea. No drug use  Tobacco use  Never smoker.  Family History ([name], [place]; 11/18/2019 9:02 AM) Depression  Mother. Heart Disease  Father, Mother. Hypertension  Brother, Daughter, Father, Mother. Kidney Disease  Father. Migraine Headache  Mother. Respiratory Condition  Brother, Father, Sister.  Pregnancy / Birth History ([name], [place]; 11/18/2019 9:02 AM) Age at menarche  9 years. Age of menopause  >61 Contraceptive History  Oral contraceptives. Gravida  2 Maternal age  4-20 Para  2 Regular periods   Other Problems ([name], CMA; 11/18/2019 9:02 AM) Arthritis  Gastroesophageal Reflux Disease  Heart murmur  Hemorrhoids  Inguinal Hernia  Pulmonary Embolism / Blood Clot in Legs  Transfusion history     Review of Systems ([name] CMA; 11/18/2019 9:02 AM) Skin Not Present- Change in Wart/Mole, Dryness, Hives, Jaundice, New Lesions, Non-Healing Wounds, Rash and Ulcer. HEENT Present- Hearing Loss. Not Present- Earache, Hoarseness, Nose Bleed, Oral Ulcers, Ringing in the Ears, Seasonal Allergies, Sinus Pain, Sore Throat, Visual Disturbances, Wears glasses/contact lenses and Yellow Eyes. Breast Not Present- Breast Mass,  Breast Pain, Nipple Discharge and Skin Changes. Endocrine Present- Hair Changes. Not Present- Cold Intolerance, Excessive Hunger, Heat Intolerance, Hot flashes and New Diabetes. Hematology Not Present- Blood Thinners, Easy Bruising, Excessive bleeding, Gland problems, HIV and Persistent Infections.  Vitals ([name] CMA; 11/18/2019 9:05 AM) 11/18/2019 9:05 AM Weight: 172.13 lb Height: 66.75in Body Surface Area: 1.89 m Body Mass Index: 27.16 kg/m  Temp.: 97.52F(Oral)  Pulse: 81 (Regular)  P.OX: 92% (Room air) BP: 146/98 (Sitting, Left Arm, Standard)       Physical Exam Stark Klein MD; 11/18/2019 9:49 AM) General Mental Status-Alert. General Appearance-Consistent with stated age. Hydration-Well hydrated. Voice-Normal.  Head and Neck Head-normocephalic, atraumatic with no lesions or palpable masses. Trachea-midline. Thyroid Gland Characteristics - normal size and consistency.  Eye Eyeball - Bilateral-Extraocular movements intact. Sclera/Conjunctiva - Bilateral-No scleral icterus.  Chest and Lung Exam Chest and lung exam reveals -quiet, even and easy respiratory effort with no use of accessory muscles and on auscultation, normal breath sounds, no adventitious sounds and normal vocal resonance. Inspection Chest Wall - Normal. Back - normal.  Cardiovascular Cardiovascular examination reveals -normal heart sounds, regular rate and rhythm with no murmurs and normal pedal pulses bilaterally.  Abdomen Inspection Inspection of the abdomen reveals - No Hernias. Palpation/Percussion Palpation and Percussion of the abdomen reveal - Soft, Non Tender, No Rebound tenderness and No Rigidity (guarding). Auscultation Auscultation of the abdomen reveals - Bowel sounds normal. Note: tip of spleen palpable.   Neurologic Neurologic evaluation reveals -alert and oriented x 3 with no impairment of recent or remote memory. Mental  Status-Normal.  Musculoskeletal Global Assessment -Note: no gross deformities.  Normal Exam - Left-Upper Extremity Strength Normal and Lower Extremity Strength Normal. Normal Exam - Right-Upper Extremity Strength Normal and Lower Extremity Strength Normal.  Lymphatic Head & Neck  General Head & Neck Lymphatics: Bilateral - Description - Normal. Axillary  General Axillary Region: Bilateral - Description - Normal. Tenderness - Non Tender. Femoral & Inguinal  Generalized Femoral & Inguinal Lymphatics: Bilateral - Description - No Generalized lymphadenopathy.    Assessment & Plan Stark Klein MD; 11/18/2019 9:51 AM) SPLENIC MASS (R16.1) Impression: Pt has had enlarging splenic mass for several years. Likely this is benign, but patient does not have pathology for this. She has some nausea which could be due to size of the mass.  Will plan laparoscopic splenectomy. Discussed that I may need hand port. Reviewed risks including bleeding, pancreatic leak, possible heart or lung issues, blood clot, death, infection, hernia. Discussed need for vaccines.  This is medically quite complex due to the need for surgery, possibility of cancer, significance of risks involved wtih surgery and long term issues.  Will set up for lap splenectomy. Current Plans You are being scheduled for surgery- Our schedulers will call you.  You should hear from our office's scheduling department within 5 working days about the location, date, and time of surgery. We try to make accommodations for patient's preferences in scheduling surgery, but sometimes the OR schedule or the surgeon's schedule prevents [place] from making those accommodations.  If you have not heard from our office [phone]) in 5 working days, call the office and ask for your surgeon's nurse.  If you have other questions about your diagnosis, plan, or surgery, call the office and ask for your surgeon's nurse.  Pt Education - [facility]  Education - Written Instructions given: discussed with patient and provided information.   Signed electronically by Stark Klein, MD (11/18/2019 9:52 AM)

## 2019-11-26 NOTE — Anesthesia Preprocedure Evaluation (Addendum)
Anesthesia Evaluation  Patient identified by MRN, date of birth, ID band Patient awake    Reviewed: Allergy & Precautions, NPO status , Patient's Chart, lab work & pertinent test results  History of Anesthesia Complications (+) history of anesthetic complications  Airway Mallampati: II  TM Distance: >3 FB Neck ROM: Full    Dental no notable dental hx. (+) Teeth Intact, Implants, Dental Advisory Given   Pulmonary neg pulmonary ROS,    Pulmonary exam normal breath sounds clear to auscultation       Cardiovascular hypertension, Normal cardiovascular exam Rhythm:Regular Rate:Normal     Neuro/Psych negative neurological ROS  negative psych ROS   GI/Hepatic Neg liver ROS, PUD, GERD  ,  Endo/Other  Hypothyroidism   Renal/GU      Musculoskeletal negative musculoskeletal ROS (+)   Abdominal   Peds  Hematology T&S     Anesthesia Other Findings   Reproductive/Obstetrics                            Anesthesia Physical Anesthesia Plan  ASA: III  Anesthesia Plan: General   Post-op Pain Management:    Induction: Intravenous  PONV Risk Score and Plan: 3 and Treatment may vary due to age or medical condition, Ondansetron and Dexamethasone  Airway Management Planned: Oral ETT  Additional Equipment: None  Intra-op Plan:   Post-operative Plan: Extubation in OR  Informed Consent: I have reviewed the patients History and Physical, chart, labs and discussed the procedure including the risks, benefits and alternatives for the proposed anesthesia with the patient or authorized representative who has indicated his/her understanding and acceptance.     Dental advisory given  Plan Discussed with: CRNA  Anesthesia Plan Comments:         Anesthesia Quick Evaluation

## 2019-11-26 NOTE — Anesthesia Procedure Notes (Signed)
Procedure Name: Intubation Date/Time: 11/26/2019 2:03 PM Performed by: Audry Pili, MD Pre-anesthesia Checklist: Patient identified, Emergency Drugs available, Suction available and Patient being monitored Patient Re-evaluated:Patient Re-evaluated prior to induction Oxygen Delivery Method: Circle system utilized Preoxygenation: Pre-oxygenation with 100% oxygen Induction Type: IV induction Ventilation: Mask ventilation without difficulty Laryngoscope Size: Miller and 2 Grade View: Grade II Tube type: Oral Tube size: 7.0 mm Number of attempts: 2 (First attempt by CRNA - esophageal intubation) Airway Equipment and Method: Stylet and Oral airway Placement Confirmation: ETT inserted through vocal cords under direct vision,  positive ETCO2 and breath sounds checked- equal and bilateral Secured at: 21 cm Tube secured with: Tape Dental Injury: Teeth and Oropharynx as per pre-operative assessment

## 2019-11-26 NOTE — Anesthesia Postprocedure Evaluation (Signed)
Anesthesia Post Note  Patient: Miranda Russell  Procedure(s) Performed: LAPAROSCOPIC SPLENECTOMY (N/A Abdomen)     Patient location during evaluation: PACU Anesthesia Type: General Level of consciousness: awake and alert Pain management: pain level controlled Vital Signs Assessment: post-procedure vital signs reviewed and stable Respiratory status: spontaneous breathing, nonlabored ventilation and respiratory function stable Cardiovascular status: blood pressure returned to baseline and stable Postop Assessment: no apparent nausea or vomiting Anesthetic complications: no    Last Vitals:  Vitals:   11/26/19 1755 11/26/19 1810  BP: 107/78 111/77  Pulse: 61 61  Resp: 14 14  Temp: (!) 36.1 C (!) 36.1 C  SpO2: 92% 93%    Last Pain:  Vitals:   11/26/19 1810  TempSrc:   PainSc: 0-No pain                 Audry Pili

## 2019-11-26 NOTE — Anesthesia Procedure Notes (Signed)
Anesthesia Regional Block: TAP block   Pre-Anesthetic Checklist: ,, timeout performed, Correct Patient, Correct Site, Correct Laterality, Correct Procedure, Correct Position, site marked, Risks and benefits discussed,  Surgical consent,  Pre-op evaluation,  At surgeon's request and post-op pain management  Laterality: Left  Prep: Maximum Sterile Barrier Precautions used, chloraprep       Needles:   Needle Type: Echogenic Needle     Needle Length: 9cm  Needle Gauge: 21     Additional Needles:   Procedures: Doppler guided,,,, ultrasound used (permanent image in chart),,,,  Narrative:  Start time: 11/26/2019 12:57 PM End time: 11/26/2019 1:02 PM  Performed by: Personally  Anesthesiologist: Barnet Glasgow, MD  Additional Notes: Pt tolerated procedure well

## 2019-11-26 NOTE — Transfer of Care (Signed)
Immediate Anesthesia Transfer of Care Note  Patient: Miranda Russell  Procedure(s) Performed: LAPAROSCOPIC SPLENECTOMY (N/A Abdomen)  Patient Location: PACU  Anesthesia Type:General  Level of Consciousness: awake, alert  and oriented  Airway & Oxygen Therapy: Patient Spontanous Breathing  Post-op Assessment: Report given to RN and Post -op Vital signs reviewed and stable  Post vital signs: Reviewed and stable  Last Vitals:  Vitals Value Taken Time  BP 140/88 11/26/19 1637  Temp    Pulse 65 11/26/19 1645  Resp 16 11/26/19 1645  SpO2 92 % 11/26/19 1645  Vitals shown include unvalidated device data.  Last Pain:  Vitals:   11/26/19 1040  TempSrc: Oral         Complications: No apparent anesthesia complications

## 2019-11-26 NOTE — Anesthesia Procedure Notes (Signed)
Anesthesia Regional Block: TAP block   Pre-Anesthetic Checklist: ,, timeout performed, Correct Patient, Correct Site, Correct Laterality, Correct Procedure, Correct Position, site marked, Risks and benefits discussed,  Surgical consent,  Pre-op evaluation,  At surgeon's request and post-op pain management  Laterality: Right  Prep: Maximum Sterile Barrier Precautions used, chloraprep       Needles:  Injection technique: Single-shot  Needle Type: Echogenic Needle     Needle Length: 9cm  Needle Gauge: 21     Additional Needles:   Procedures: Doppler guided, nerve stimulator,,, ultrasound used (permanent image in chart),,,,  Narrative:  Start time: 11/26/2019 12:50 PM End time: 11/26/2019 12:56 PM  Performed by: Personally  Anesthesiologist: Barnet Glasgow, MD  Additional Notes: Pt tolerated procedure well

## 2019-11-27 LAB — BASIC METABOLIC PANEL
Anion gap: 7 (ref 5–15)
BUN: 7 mg/dL — ABNORMAL LOW (ref 8–23)
CO2: 26 mmol/L (ref 22–32)
Calcium: 7.9 mg/dL — ABNORMAL LOW (ref 8.9–10.3)
Chloride: 105 mmol/L (ref 98–111)
Creatinine, Ser: 0.69 mg/dL (ref 0.44–1.00)
GFR calc Af Amer: >60 mL/min (ref >60)
GFR calc non Af Amer: >60 mL/min (ref >60)
Glucose, Bld: 216 mg/dL — ABNORMAL HIGH (ref 70–99)
Potassium: 3.9 mmol/L (ref 3.5–5.1)
Sodium: 138 mmol/L (ref 135–145)

## 2019-11-27 LAB — CBC
HCT: 36 % (ref 36.0–46.0)
Hemoglobin: 11.3 g/dL — ABNORMAL LOW (ref 12.0–15.0)
MCH: 28.6 pg (ref 26.0–34.0)
MCHC: 31.4 g/dL (ref 30.0–36.0)
MCV: 91.1 fL (ref 80.0–100.0)
Platelets: 208 10*3/uL (ref 150–400)
RBC: 3.95 MIL/uL (ref 3.87–5.11)
RDW: 13.9 % (ref 11.5–15.5)
WBC: 8.6 10*3/uL (ref 4.0–10.5)
nRBC: 0 % (ref 0.0–0.2)

## 2019-11-27 LAB — PHOSPHORUS: Phosphorus: 2.6 mg/dL (ref 2.5–4.6)

## 2019-11-27 LAB — MAGNESIUM: Magnesium: 1.7 mg/dL (ref 1.7–2.4)

## 2019-11-27 MED ORDER — ALUM & MAG HYDROXIDE-SIMETH 200-200-20 MG/5ML PO SUSP
30.0000 mL | Freq: Four times a day (QID) | ORAL | Status: DC | PRN
  Administered 2019-11-27: 30 mL via ORAL
  Filled 2019-11-27: qty 30

## 2019-11-27 NOTE — Progress Notes (Signed)
Moderate amount of bleeding noted on patient's gown coming from drain site. Saturated Bio patch removed and new dressing applied. Will continue to monitor.

## 2019-11-27 NOTE — Progress Notes (Signed)
1 Day Post-Op   Subjective/Chief Complaint: Doing well this AM.  Pain ok.  Avoiding iv narcotics as they make her feel sick sometimes.     Objective: Vital signs in last 24 hours: Temp:  [97 F (36.1 C)-97.8 F (36.6 C)] 97.5 F (36.4 C) (02/25 0457) Pulse Rate:  [61-86] 86 (02/25 0457) Resp:  [14-20] 16 (02/25 0457) BP: (107-151)/(75-105) 130/87 (02/25 0457) SpO2:  [92 %-99 %] 96 % (02/25 0457) Weight:  [75.3 kg-76.9 kg] 76.9 kg (02/24 2004) Last BM Date: 11/26/19  Intake/Output from previous day: 02/24 0701 - 02/25 0700 In: 3202.1 [I.V.:3027.1; IV Piggyback:100] Out: 1880 [Urine:1300; Drains:230; Blood:350] Intake/Output this shift: Total I/O In: 300 [P.O.:300] Out: -   General appearance: alert, cooperative and no distress Resp: breathing comfortably Cardio: regular rate and rhythm GI: soft, non distended, approp tender. incision c/d/i.  drain serosang. Extremities: extremities normal, atraumatic, no cyanosis or edema  Lab Results:  Recent Labs    11/26/19 2049 11/27/19 0118  WBC 10.8* 8.6  HGB 12.1 11.3*  HCT 37.5 36.0  PLT 188 208   BMET Recent Labs    11/26/19 1102 11/26/19 1102 11/26/19 2049 11/27/19 0118  NA 140  --   --  138  K 3.7  --   --  3.9  CL 105  --   --  105  CO2 24  --   --  26  GLUCOSE 70  --   --  216*  BUN 9  --   --  7*  CREATININE 0.64   < > 0.70 0.69  CALCIUM 8.7*  --   --  7.9*   < > = values in this interval not displayed.   PT/INR Recent Labs    11/26/19 1102  LABPROT 14.3  INR 1.1   ABG No results for input(s): PHART, HCO3 in the last 72 hours.  Invalid input(s): PCO2, PO2  Studies/Results: No results found.  Anti-infectives: Anti-infectives (From admission, onward)   Start     Dose/Rate Route Frequency Ordered Stop   11/26/19 2200  ceFAZolin (ANCEF) IVPB 2g/100 mL premix     2 g 200 mL/hr over 30 Minutes Intravenous Every 8 hours 11/26/19 2005 11/26/19 2143   11/26/19 1033  ceFAZolin (ANCEF) 2-4 GM/100ML-%  IVPB    Note to Pharmacy: Odelia Gage   : cabinet override      11/26/19 1033 11/26/19 1419   11/26/19 1030  ceFAZolin (ANCEF) IVPB 2g/100 mL premix     2 g 200 mL/hr over 30 Minutes Intravenous On call to O.R. 11/26/19 1028 11/26/19 1445      Assessment/Plan: s/p Procedure(s): LAPAROSCOPIC SPLENECTOMY (N/A) foley out today.  Diet advance as tolerated. Await return of bowel function Await path- anticipate benign mass.   LOS: 1 day    Stark Klein 11/27/2019

## 2019-11-27 NOTE — Plan of Care (Signed)
  Problem: Education: Goal: Required Educational Video(s) Outcome: Progressing

## 2019-11-28 LAB — BASIC METABOLIC PANEL
Anion gap: 7 (ref 5–15)
BUN: <5 mg/dL — ABNORMAL LOW (ref 8–23)
CO2: 25 mmol/L (ref 22–32)
Calcium: 7.7 mg/dL — ABNORMAL LOW (ref 8.9–10.3)
Chloride: 107 mmol/L (ref 98–111)
Creatinine, Ser: 0.56 mg/dL (ref 0.44–1.00)
GFR calc Af Amer: >60 mL/min (ref >60)
GFR calc non Af Amer: >60 mL/min (ref >60)
Glucose, Bld: 108 mg/dL — ABNORMAL HIGH (ref 70–99)
Potassium: 3.8 mmol/L (ref 3.5–5.1)
Sodium: 139 mmol/L (ref 135–145)

## 2019-11-28 LAB — CBC
HCT: 32.3 % — ABNORMAL LOW (ref 36.0–46.0)
Hemoglobin: 10.4 g/dL — ABNORMAL LOW (ref 12.0–15.0)
MCH: 29 pg (ref 26.0–34.0)
MCHC: 32.2 g/dL (ref 30.0–36.0)
MCV: 90 fL (ref 80.0–100.0)
Platelets: 194 10*3/uL (ref 150–400)
RBC: 3.59 MIL/uL — ABNORMAL LOW (ref 3.87–5.11)
RDW: 14.5 % (ref 11.5–15.5)
WBC: 9.1 10*3/uL (ref 4.0–10.5)
nRBC: 0 % (ref 0.0–0.2)

## 2019-11-28 NOTE — Progress Notes (Signed)
2 Days Post-Op   Subjective/Chief Complaint: Passing gas.  Tolerated full liquids. No n/v.  Pain tolerable.       Objective: Vital signs in last 24 hours: Temp:  [98.2 F (36.8 C)-98.8 F (37.1 C)] 98.2 F (36.8 C) (02/26 0455) Pulse Rate:  [72-75] 75 (02/26 0455) Resp:  [17] 17 (02/26 0455) BP: (154-162)/(85-99) 162/99 (02/26 0455) SpO2:  [94 %-96 %] 94 % (02/26 0455) Last BM Date: 11/26/19  Intake/Output from previous day: 02/25 0701 - 02/26 0700 In: 34 [P.O.:960] Out: 152 [Urine:2; Drains:150] Intake/Output this shift: Total I/O In: 420 [P.O.:420] Out: 50 [Drains:50]  General appearance: alert, cooperative and no distress Resp: breathing comfortably Cardio: regular rate and rhythm GI: soft, non distended, approp tender. incision c/d/i.  drain serosang. Some bruising at incision. Extremities: extremities normal, atraumatic, no cyanosis or edema  Lab Results:  Recent Labs    11/27/19 0118 11/28/19 0335  WBC 8.6 9.1  HGB 11.3* 10.4*  HCT 36.0 32.3*  PLT 208 194   BMET Recent Labs    11/27/19 0118 11/28/19 0335  NA 138 139  K 3.9 3.8  CL 105 107  CO2 26 25  GLUCOSE 216* 108*  BUN 7* <5*  CREATININE 0.69 0.56  CALCIUM 7.9* 7.7*   PT/INR Recent Labs    11/26/19 1102  LABPROT 14.3  INR 1.1   ABG No results for input(s): PHART, HCO3 in the last 72 hours.  Invalid input(s): PCO2, PO2  Studies/Results: No results found.  Anti-infectives: Anti-infectives (From admission, onward)   Start     Dose/Rate Route Frequency Ordered Stop   11/26/19 2200  ceFAZolin (ANCEF) IVPB 2g/100 mL premix     2 g 200 mL/hr over 30 Minutes Intravenous Every 8 hours 11/26/19 2005 11/26/19 2143   11/26/19 1033  ceFAZolin (ANCEF) 2-4 GM/100ML-% IVPB    Note to Pharmacy: Odelia Gage   : cabinet override      11/26/19 1033 11/26/19 1419   11/26/19 1030  ceFAZolin (ANCEF) IVPB 2g/100 mL premix     2 g 200 mL/hr over 30 Minutes Intravenous On call to O.R. 11/26/19  1028 11/26/19 1445      Assessment/Plan: s/p Procedure(s): LAPAROSCOPIC SPLENECTOMY (N/A)  Diet advance as tolerated to soft. Await return of bowel function  Await path- anticipate benign mass.  Probably home tomorrow.  Possibly drain out tomorrow depending on output.     LOS: 2 days    Stark Klein 11/28/2019

## 2019-11-29 LAB — BASIC METABOLIC PANEL
Anion gap: 9 (ref 5–15)
BUN: 5 mg/dL — ABNORMAL LOW (ref 8–23)
CO2: 26 mmol/L (ref 22–32)
Calcium: 8.4 mg/dL — ABNORMAL LOW (ref 8.9–10.3)
Chloride: 104 mmol/L (ref 98–111)
Creatinine, Ser: 0.7 mg/dL (ref 0.44–1.00)
GFR calc Af Amer: >60 mL/min (ref >60)
GFR calc non Af Amer: >60 mL/min (ref >60)
Glucose, Bld: 102 mg/dL — ABNORMAL HIGH (ref 70–99)
Potassium: 3.6 mmol/L (ref 3.5–5.1)
Sodium: 139 mmol/L (ref 135–145)

## 2019-11-29 LAB — CBC
HCT: 37 % (ref 36.0–46.0)
Hemoglobin: 11.8 g/dL — ABNORMAL LOW (ref 12.0–15.0)
MCH: 29 pg (ref 26.0–34.0)
MCHC: 31.9 g/dL (ref 30.0–36.0)
MCV: 90.9 fL (ref 80.0–100.0)
Platelets: 271 10*3/uL (ref 150–400)
RBC: 4.07 MIL/uL (ref 3.87–5.11)
RDW: 14.6 % (ref 11.5–15.5)
WBC: 7.7 10*3/uL (ref 4.0–10.5)
nRBC: 0 % (ref 0.0–0.2)

## 2019-11-29 MED ORDER — PNEUMOCOCCAL VAC POLYVALENT 25 MCG/0.5ML IJ INJ: 0.5000 mL | INJECTION | Freq: Once | INTRAMUSCULAR | Status: DC

## 2019-11-29 MED ORDER — MENINGOCOCCAL A C Y&W-135 OLIG IM SOLR
0.5000 mL | INTRAMUSCULAR | Status: AC | PRN
  Administered 2019-11-29: 0.5 mL via INTRAMUSCULAR
  Filled 2019-11-29: qty 0.5

## 2019-11-29 MED ORDER — HAEMOPHILUS B POLYSAC CONJ VAC 10 MCG IJ SOLR: 0.5000 mL | Freq: Once | INTRAMUSCULAR | Status: DC

## 2019-11-29 MED ORDER — MENINGOCOCCAL A C Y&W-135 OLIG IM SOLR: 0.5000 mL | Freq: Once | INTRAMUSCULAR | Status: DC

## 2019-11-29 MED ORDER — OXYCODONE HCL 5 MG PO TABS: 5.0000 mg | ORAL_TABLET | ORAL | 0 refills | Status: DC | PRN

## 2019-11-29 MED ORDER — PNEUMOCOCCAL VAC POLYVALENT 25 MCG/0.5ML IJ INJ: 0.5000 mL | INJECTION | INTRAMUSCULAR | Status: DC | PRN

## 2019-11-29 MED ORDER — BISACODYL 10 MG RE SUPP
10.0000 mg | Freq: Once | RECTAL | Status: AC
  Administered 2019-11-29: 10 mg via RECTAL
  Filled 2019-11-29: qty 1

## 2019-11-29 MED ORDER — HAEMOPHILUS B POLYSAC CONJ VAC 10 MCG IJ SOLR
0.5000 mL | INTRAMUSCULAR | Status: AC | PRN
  Administered 2019-11-29: 0.5 mL via INTRAMUSCULAR
  Filled 2019-11-29 (×2): qty 0.5

## 2019-11-29 NOTE — Progress Notes (Signed)
3 Days Post-Op   Subjective/Chief Complaint: Tolerating soft diet.  No bm yet.  No n/v.  No hiccups.  Pain controlled.    Objective: Vital signs in last 24 hours: Temp:  [97.8 F (36.6 C)-98.4 F (36.9 C)] 98.2 F (36.8 C) (02/27 0520) Pulse Rate:  [73-84] 84 (02/27 0520) Resp:  [15-16] 15 (02/27 0520) BP: (149-161)/(89-98) 152/98 (02/27 0520) SpO2:  [95 %-96 %] 95 % (02/27 0520) Last BM Date: 11/26/19  Intake/Output from previous day: 02/26 0701 - 02/27 0700 In: 1257 [P.O.:1257] Out: 100 [Drains:100] Intake/Output this shift: No intake/output data recorded.  General appearance: alert, cooperative and no distress Resp: breathing comfortably GI: soft, non distended, approp tender. incision c/d/i.  drain serosang. Some bruising at incision. Extremities: extremities normal, atraumatic, no cyanosis or edema  Lab Results:  Recent Labs    11/28/19 0335 11/29/19 0456  WBC 9.1 7.7  HGB 10.4* 11.8*  HCT 32.3* 37.0  PLT 194 271   BMET Recent Labs    11/28/19 0335 11/29/19 0456  NA 139 139  K 3.8 3.6  CL 107 104  CO2 25 26  GLUCOSE 108* 102*  BUN <5* 5*  CREATININE 0.56 0.70  CALCIUM 7.7* 8.4*   PT/INR Recent Labs    11/26/19 1102  LABPROT 14.3  INR 1.1   ABG No results for input(s): PHART, HCO3 in the last 72 hours.  Invalid input(s): PCO2, PO2  Studies/Results: No results found.  Anti-infectives: Anti-infectives (From admission, onward)   Start     Dose/Rate Route Frequency Ordered Stop   11/26/19 2200  ceFAZolin (ANCEF) IVPB 2g/100 mL premix     2 g 200 mL/hr over 30 Minutes Intravenous Every 8 hours 11/26/19 2005 11/26/19 2143   11/26/19 1033  ceFAZolin (ANCEF) 2-4 GM/100ML-% IVPB    Note to Pharmacy: Odelia Gage   : cabinet override      11/26/19 1033 11/26/19 1419   11/26/19 1030  ceFAZolin (ANCEF) IVPB 2g/100 mL premix     2 g 200 mL/hr over 30 Minutes Intravenous On call to O.R. 11/26/19 1028 11/26/19 1445      Assessment/Plan: s/p  Procedure(s): LAPAROSCOPIC SPLENECTOMY (N/A)  Soft diet Suppository today.  Await path- anticipate benign mass.  Probably home later today if has BM. D/c JP   LOS: 3 days    Stark Klein 11/29/2019

## 2019-11-29 NOTE — Discharge Summary (Signed)
Physician Discharge Summary  Patient ID: Miranda Russell MRN: [id number] DOB/AGE: 11-May-1946 74 y.o.  Admit date: 11/26/2019 Discharge date: 11/29/2019  Admission Diagnoses: Patient Active Problem List   Diagnosis Date Noted  . Splenic mass 05/12/2019  . Mediastinal mass 05/12/2019  HTN hypothyroidism  Discharge Diagnoses:  Active Problems:   Splenic mass and same as above.    Discharged Condition: stable  Hospital Course:  Pt was admitted to the floor following hand assisted laparoscopic splenectomy 11/26/2019 for presumed benign splenic mass that had been enlarging.  Pt had foley removed POD 1 and was able to void.  She tolerated diet advance as well as transition to oral pain medications.  She began passing gas.  Her JP output remained serosanguinous and was removed prior to d/c.  She received post splenectomy vaccines prior to d/c.  She had suppository to assist with BM.  Her pathology was not back prior to d/c.    Consults: None  Significant Diagnostic Studies: labs: WBCs 7.7, HCT 37, Cr 0.7 prior to d/c.    Treatments: surgery: as above.    Discharge Exam: Blood pressure (!) 152/98, pulse 84, temperature 98.2 F (36.8 C), temperature source Oral, resp. rate 15, height 5\' 6"  (1.676 m), weight 76.9 kg, SpO2 95 %. General appearance: alert, cooperative and no distress Resp: breathing comfortably GI: soft, non distended, mildly approp tender.  drain serosang prior to d/c.  Extremities: extremities normal, atraumatic, no cyanosis or edema  Disposition: Discharge disposition: 01-Home or Self Care       Discharge Instructions    Call MD for:  difficulty breathing, headache or visual disturbances   Complete by: As directed    Call MD for:  hives   Complete by: As directed    Call MD for:  persistant nausea and vomiting   Complete by: As directed    Call MD for:  redness, tenderness, or signs of infection (pain, swelling, redness, odor or green/yellow discharge around  incision site)   Complete by: As directed    Call MD for:  severe uncontrolled pain   Complete by: As directed    Call MD for:  temperature >100.4   Complete by: As directed    Diet - low sodium heart healthy   Complete by: As directed    Increase activity slowly   Complete by: As directed      Allergies as of 11/29/2019      Reactions   Morphine And Related Itching   PATIENT   Codeine    PATIENT   Keppra [levetiracetam] Hives, Swelling      Medication List    TAKE these medications   acetaminophen 500 MG tablet Commonly known as: TYLENOL Take 500-1,000 mg by mouth every 6 (six) hours as needed (for pain.).   denosumab 60 MG/ML Sosy injection Commonly known as: PROLIA Inject 60 mg into the skin every 6 (six) months.   irbesartan 300 MG tablet Commonly known as: AVAPRO Take 300 mg by mouth daily.   latanoprost 0.005 % ophthalmic solution Commonly known as: XALATAN Place 1 drop into both eyes at bedtime.   levothyroxine 137 MCG tablet Commonly known as: SYNTHROID Take 137 mcg by mouth daily before breakfast.   multivitamin with minerals Tabs tablet Take 1 tablet by mouth daily with lunch. Alive for Women 50+   omeprazole 20 MG capsule Commonly known as: PRILOSEC Take 20 mg by mouth daily before supper.   oxyCODONE 5 MG immediate release tablet Commonly known as:  Oxy IR/ROXICODONE Take 1-2 tablets (5-10 mg total) by mouth every 4 (four) hours as needed for moderate pain, severe pain or breakthrough pain.   timolol 0.5 % ophthalmic solution Commonly known as: TIMOPTIC Place 1 drop into both eyes in the morning and at bedtime.   verapamil 240 MG CR tablet Commonly known as: CALAN-SR Take 120 mg by mouth daily.   Vitamin D (Ergocalciferol) 1.25 MG (50000 UNIT) Caps capsule Commonly known as: DRISDOL Take 50,000 Units by mouth every Monday.      Follow-up Information    Stark Klein, MD Follow up in 2 week(s).   Specialty: General Surgery Contact  information: [address] [place] [address] [phone]           Signed: Stark Klein 11/29/2019, 7:58 AM

## 2019-11-29 NOTE — Progress Notes (Signed)
D/c patient to home after having a bowel movement. D/c instructions was given. No questions verbalized by the patient.

## 2019-11-29 NOTE — Discharge Instructions (Signed)
[facility]      [facility] °[phone] ° °ABDOMINAL SURGERY: POST OP INSTRUCTIONS ° °Always review your discharge instruction sheet given to you by the facility where your surgery was performed. ° °IF YOU HAVE DISABILITY OR FAMILY LEAVE FORMS, YOU MUST BRING THEM TO THE OFFICE FOR PROCESSING.  PLEASE DO NOT GIVE THEM TO YOUR DOCTOR. ° °1. A prescription for pain medication may be given to you upon discharge.  Take your pain medication as prescribed, if needed.  If narcotic pain medicine is not needed, then you may take acetaminophen (Tylenol) or ibuprofen (Advil) as needed. °2. Take your usually prescribed medications unless otherwise directed. °3. If you need a refill on your pain medication, please contact your pharmacy. They will contact our office to request authorization.  Prescriptions will not be filled after 5pm or on week-ends. °4. You should follow a light diet the first few days after arrival home, such as soup and crackers, pudding, etc.unless your doctor has advised otherwise. A high-fiber, low fat diet can be resumed as tolerated.   Be sure to include lots of fluids daily. Most patients will experience some swelling and bruising on the chest and neck area.  Ice packs will help.  Swelling and bruising can take several days to resolve °5. Most patients will experience some swelling and bruising in the area of the incision. Ice pack will help. Swelling and bruising can take several days to resolve..  °6. It is common to experience some constipation if taking pain medication after surgery.  Increasing fluid intake and taking a stool softener will usually help or prevent this problem from occurring.  A mild laxative (Milk of Magnesia or Miralax) should be taken according to package directions if there are no bowel movements after 48 hours. °7.  You may have steri-strips (small skin tapes) in place directly over the incision.  These strips should be left on the skin for 10-14 days.  If your  surgeon used skin glue on the incision, you may shower in 48 hours.  The glue will flake off over the next 2-3 weeks.  Any sutures or staples will be removed at the office during your follow-up visit. You may find that a light gauze bandage over your incision may keep your staples from being rubbed or pulled. You may shower and replace the bandage daily. °8. ACTIVITIES:  You may resume regular (light) daily activities beginning the next day--such as daily self-care, walking, climbing stairs--gradually increasing activities as tolerated.  You may have sexual intercourse when it is comfortable.  Refrain from any heavy lifting or straining until approved by your doctor. °a. You may drive when you no longer are taking prescription pain medication, you can comfortably wear a seatbelt, and you can safely maneuver your car and apply brakes °b. Return to Work: __________8 weeks if applicable_________________________ °9. You should see your doctor in the office for a follow-up appointment approximately two weeks after your surgery.  Make sure that you call for this appointment within a day or two after you arrive home to insure a convenient appointment time. °OTHER INSTRUCTIONS:  °_____________________________________________________________ °_____________________________________________________________ ° °WHEN TO CALL YOUR DOCTOR: °1. Fever over 101.0 °2. Inability to urinate °3. Nausea and/or vomiting °4. Extreme swelling or bruising °5. Continued bleeding from incision. °6. Increased pain, redness, or drainage from the incision. °7. Difficulty swallowing or breathing °8. Muscle cramping or spasms. °9. Numbness or tingling in hands or feet or around lips. ° °The clinic staff is   available to answer your questions during regular business hours.  Please don’t hesitate to call and ask to speak to one of the nurses if you have concerns. ° °For further questions, please visit [email or web address] ° ° ° °

## 2019-11-29 NOTE — Plan of Care (Signed)
  Problem: Education: Goal: Required Educational Video(s) Outcome: Completed/Met   Problem: Clinical Measurements: Goal: Postoperative complications will be avoided or minimized Outcome: Completed/Met   Problem: Skin Integrity: Goal: Demonstration of wound healing without infection will improve Outcome: Completed/Met   Problem: Education: Goal: Required Educational Video(s) Outcome: Completed/Met   Problem: Clinical Measurements: Goal: Ability to maintain clinical measurements within normal limits will improve Outcome: Completed/Met Goal: Postoperative complications will be avoided or minimized Outcome: Completed/Met   Problem: Skin Integrity: Goal: Demonstration of wound healing without infection will improve Outcome: Completed/Met

## 2019-12-03 LAB — SURGICAL PATHOLOGY

## 2019-12-08 ENCOUNTER — Ambulatory Visit (INDEPENDENT_AMBULATORY_CARE_PROVIDER_SITE_OTHER): Payer: Medicare Other

## 2019-12-08 ENCOUNTER — Other Ambulatory Visit: Payer: Self-pay

## 2019-12-08 DIAGNOSIS — J9859 Other diseases of mediastinum, not elsewhere classified: Secondary | ICD-10-CM | POA: Diagnosis not present

## 2019-12-08 MED ORDER — IOHEXOL 300 MG/ML  SOLN
75.0000 mL | Freq: Once | INTRAMUSCULAR | Status: AC | PRN
  Administered 2019-12-08: 75 mL via INTRAVENOUS

## 2019-12-10 NOTE — Progress Notes (Signed)
Please let Miranda Russell know that her CT scan shows a stable lymph node in her mediastinum and no additional follow up is required. Thanks!

## 2020-01-06 DIAGNOSIS — S3600XA Unspecified injury of spleen, initial encounter: Secondary | ICD-10-CM | POA: Insufficient documentation

## 2020-01-06 DIAGNOSIS — H409 Unspecified glaucoma: Secondary | ICD-10-CM | POA: Insufficient documentation

## 2020-06-24 ENCOUNTER — Encounter: Payer: Self-pay | Admitting: Cardiology

## 2020-06-24 ENCOUNTER — Other Ambulatory Visit: Payer: Self-pay

## 2020-06-24 ENCOUNTER — Ambulatory Visit: Payer: Medicare Other | Admitting: Cardiology

## 2020-06-24 VITALS — BP 146/94 | HR 74 | Ht 65.5 in | Wt 167.2 lb

## 2020-06-24 DIAGNOSIS — Z7189 Other specified counseling: Secondary | ICD-10-CM

## 2020-06-24 DIAGNOSIS — Z8249 Family history of ischemic heart disease and other diseases of the circulatory system: Secondary | ICD-10-CM | POA: Diagnosis not present

## 2020-06-24 DIAGNOSIS — I10 Essential (primary) hypertension: Secondary | ICD-10-CM | POA: Diagnosis not present

## 2020-06-24 NOTE — Progress Notes (Signed)
Cardiology Office Note:    Date:  06/24/2020   ID:  [name], Common [birth year], MRN [id number]  PCP:  Miranda Cruel, MD  Cardiologist:  Miranda Dresser, MD  Referring MD: Miranda Cruel, MD   CC: new patient evaluation for weakness, hypertension, and family history of CV disease  History of Present Illness:    Miranda Russell is a 74 y.o. female with a hx of hypertension who is seen as a new consult at the request of Miranda Cruel, MD for the evaluation and management of progressive weakness, family history of CV disease and hypertension.  Referral note from Dr Miranda Russell dated 05/14/20 reviewed. Comment is hypertension, family history of cardiovascular disease, and weakness. Note from Dr. Harrington Russell dated 04/21/20 reviewed. Has a history of hypertension, on verapamil and irbesartan.  Today: Patient's main concern is that she has a family history of heart disease, wants to make sure her heart is ok.  Notes that sudden turns or position changes make her lightheaded, no syncope.  Cardiovascular risk factors: Prior clinical ASCVD: none Comorbid conditions: Endorses hypertension Denies hyperlipidemia, diabetes, chronic kidney disease  Metabolic syndrome/Obesity: Current BMI 27, highest adult weight 173 lbs. Chronic inflammatory conditions: none. Has osteoporosis but no inflammatory disease Tobacco use history: never Family history: mother was one of 71 kids. 8 of them died of heart issues, 8 died of cancer. Mother died age 74 of heart failure. No premature heart disease that she knows of. Father had aortic enlargement. One brother with COPD/lung cancer, sister COPD. Both were smokers. Prior cardiac testing and/or incidental findings on other testing (ie coronary calcium): none Exercise level: does all ADLs, climbs stairs, does housework, no limitations but no intentional exercise Current diet: eats lots of fruits, vegetables, lean protein. Trying to watch the bread.  Had an  episode when she was walking with her great grandkids, turned her head and felt very weak, had to sit down.   Denies recent chest pain (two remote episodes of severe indigestion), shortness of breath at rest or with normal exertion. No PND, orthopnea, LE edema or unexpected weight gain. No syncope or palpitations.  We discussed no treatment, empiric statin, or calcium score for risk stratification. After shared decision making, will check lipids and get calcium score. Has not had lipids since 2017, but reports that it has never been high.   Past Medical History:  Diagnosis Date  . Arthritis   . Colon polyp   . Epistaxis   . Fibroids   . GERD (gastroesophageal reflux disease)   . Glaucoma   . Hypertension   . Hypothyroidism    had thyroid removed  . Murmur    " innocent murmur"   . Peptic ulcer disease   . PONV (postoperative nausea and vomiting)   . Splenic mass   . Wears glasses     Past Surgical History:  Procedure Laterality Date  . ABDOMINAL HYSTERECTOMY    . APPENDECTOMY    . BREAST SURGERY    . CATARACT EXTRACTION W/ INTRAOCULAR LENS  IMPLANT, BILATERAL    . CHOLECYSTECTOMY    . DENTAL SURGERY     permanent bridges  . HERNIA REPAIR    . KNEE SURGERY    . LAPAROSCOPIC SPLENECTOMY N/A 11/26/2019   Procedure: LAPAROSCOPIC SPLENECTOMY;  Surgeon: Stark Klein, MD;  Location: [facility];  Service: General;  Laterality: N/A;  . THYROIDECTOMY    . WISDOM TOOTH EXTRACTION      Current Medications: Current Outpatient  Medications on File Prior to Visit  Medication Sig  . acetaminophen (TYLENOL) 500 MG tablet Take 500-1,000 mg by mouth every 6 (six) hours as needed (for pain.).  Marland Kitchen denosumab (PROLIA) 60 MG/ML SOSY injection Inject 60 mg into the skin every 6 (six) months.  . irbesartan (AVAPRO) 300 MG tablet Take 300 mg by mouth daily.  Marland Kitchen latanoprost (XALATAN) 0.005 % ophthalmic solution Place 1 drop into both eyes at bedtime.  Marland Kitchen levothyroxine (SYNTHROID) 137 MCG tablet Take 137  mcg by mouth daily before breakfast.  . Multiple Vitamin (MULTIVITAMIN WITH MINERALS) TABS tablet Take 1 tablet by mouth daily with lunch. Alive for Women 50+  . omeprazole (PRILOSEC) 20 MG capsule Take 20 mg by mouth daily before supper.   . timolol (TIMOPTIC) 0.5 % ophthalmic solution Place 1 drop into both eyes in the morning and at bedtime.  . verapamil (CALAN-SR) 240 MG CR tablet Take 120 mg by mouth daily.   . Vitamin D, Ergocalciferol, (DRISDOL) 1.25 MG (50000 UT) CAPS capsule Take 50,000 Units by mouth every Monday.    No current facility-administered medications on file prior to visit.     Allergies:   Morphine and related, Codeine, and Keppra [levetiracetam]   Social History   Tobacco Use  . Smoking status: Never Smoker  . Smokeless tobacco: Never Used  Vaping Use  . Vaping Use: Never used  Substance Use Topics  . Alcohol use: Yes    Comment: occ  . Drug use: Never    Family History: family history includes COPD in her brother, father, and sister; Coronary artery disease in her maternal grandfather; Dementia in her father; Heart failure in her maternal aunt and mother; Lung cancer in her brother and maternal aunt. There is no history of Breast cancer.  ROS:   Please see the history of present illness.  Additional pertinent ROS: Constitutional: Negative for chills, fever, night sweats, unintentional weight loss  HENT: Negative for ear pain and hearing loss.   Eyes: Negative for loss of vision and eye pain.  Respiratory: Negative for cough, sputum, wheezing.   Cardiovascular: See HPI. Gastrointestinal: Negative for abdominal pain, melena, and hematochezia.  Genitourinary: Negative for dysuria and hematuria.  Musculoskeletal: Negative for falls and myalgias.  Skin: Negative for itching and rash.  Neurological: Negative for focal weakness, focal sensory changes and loss of consciousness.  Endo/Heme/Allergies: Does not bruise/bleed easily.     EKGs/Labs/Other Studies  Reviewed:    The following studies were reviewed today: Carotid study from 2010  EKG:  EKG is personally reviewed.  The ekg ordered today demonstrates SR with 1st degree AV block, RBBB, 74 bpm  Recent Labs: 11/26/2019: ALT 16 11/27/2019: Magnesium 1.7 11/29/2019: BUN 5; Creatinine, Ser 0.70; Hemoglobin 11.8; Platelets 271; Potassium 3.6; Sodium 139  Recent Lipid Panel No results found for: CHOL, TRIG, HDL, CHOLHDL, VLDL, LDLCALC, LDLDIRECT  Physical Exam:    VS:  BP (!) 146/94   Pulse 74   Ht 5' 5.5" (1.664 m)   Wt 167 lb 3.2 oz (75.8 kg)   SpO2 96%   BMI 27.40 kg/m     Wt Readings from Last 3 Encounters:  06/24/20 167 lb 3.2 oz (75.8 kg)  11/26/19 169 lb 8 oz (76.9 kg)  05/23/19 168 lb 6.4 oz (76.4 kg)    GEN: Well nourished, well developed in no acute distress HEENT: Normal, moist mucous membranes NECK: No JVD CARDIAC: regular rhythm, normal S1 and S2, no rubs or gallops. 1/6 systolic ejection  murmur. VASCULAR: Radial and DP pulses 2+ bilaterally. No carotid bruits RESPIRATORY:  Clear to auscultation without rales, wheezing or rhonchi  ABDOMEN: Soft, non-tender, non-distended MUSCULOSKELETAL:  Ambulates independently SKIN: Warm and dry, no edema NEUROLOGIC:  Alert and oriented x 3. No focal neuro deficits noted. PSYCHIATRIC:  Normal affect    ASSESSMENT:    1. Family history of early CAD   2. Cardiac risk counseling   3. Counseling on health promotion and disease prevention   4. Essential hypertension    PLAN:    Family history of CV disease CV risk assessment -discussed pathology of cholesterol, how plaques form, that MI/CVA result commonly from acute plaque rupture and not gradual stenosis. Discussed mechanism of statin to both decrease plaque accumulation and stabilize plaque that is already present. Discussed that calcium is a marker for plaque, with decades of validated data regarding average amounts of calcium for age/gender/ethnicity, as well as value of  calcium score for risk stratification -we reviewed the pros/cons of calcium scores. A cardiac CT scan for coronary calcium is a non-invasive way of obtaining information about the presence, location and extent of calcified plaque in the coronary arteries--the vessels that supply oxygen-containing blood to the heart muscle. Calcified plaque results when there is a build-up of fat and other substances under the inner layer of the artery. This material can calcify which signals the presence of atherosclerosis, a disease of the vessel wall, also called coronary artery disease.  People with this disease have an increased risk for heart attacks. In addition, over time, progression of plaque build up (CAD) can narrow the arteries or even close off blood flow to the heart. Because calcium is a marker of CAD, the amount of calcium detected on a cardiac CT scan is a helpful prognostic tool.  -we reviewed the charts together which show the relationship between calcium score and 15 year all cause mortality -will proceed with calcium score and lipids for risk stratification today -ADDENDED: calcium score 0. Asc Ao is 40 mm. This is different than the dedicated CT chest she had previously, which noted aortic atherosclerosis and LCx CAD. She is monitored for mediastinal mass, Ao size can be followed on these studies. -no indication for statin based on calcium score of 0 -discussed red flag warning signs that need immediate medical attention  Hypertension -continue irbesartan and verapamil  Cardiac risk counseling and prevention recommendations: -recommend heart healthy/Mediterranean diet, with whole grains, fruits, vegetable, fish, lean meats, nuts, and olive oil. Limit salt. -recommend moderate walking, 3-5 times/week for 30-50 minutes each session. Aim for at least 150 minutes.week. Goal should be pace of 3 miles/hours, or walking 1.5 miles in 30 minutes -recommend avoidance of tobacco products. Avoid excess  alcohol. -ASCVD risk score: 21% due to BP, drops significantly for controlled BP The ASCVD Risk score ([place]., et al., 2013) failed to calculate for the following reasons:   Cannot find a previous HDL lab   Cannot find a previous total cholesterol lab    Plan for follow up: to be determined based on Ca score  Miranda Dresser, MD, PhD [place]  [facility]    Medication Adjustments/Labs and Tests Ordered: Current medicines are reviewed at length with the patient today.  Concerns regarding medicines are outlined above.  Orders Placed This Encounter  Procedures  . CT CARDIAC SCORING  . EKG 12-Lead   No orders of the defined types were placed in this encounter.   Patient Instructions  Medication Instructions:  Your Physician recommend you continue on your current medication as directed.    *If you need a refill on your cardiac medications before your next appointment, please call your pharmacy*   Lab Work: None ordered   Testing/Procedures: CT Cardiac Score [address] [place]: At [facility], you and your health needs are our priority.  As part of our continuing mission to provide you with exceptional heart care, we have created designated Provider Care Teams.  These Care Teams include your primary Cardiologist (physician) and Advanced Practice Providers (APPs -  Physician Assistants and Nurse Practitioners) who all work together to provide you with the care you need, when you need it.  We recommend signing up for the patient portal called "MyChart".  Sign up information is provided on this After Visit Summary.  MyChart is used to connect with patients for Virtual Visits (Telemedicine).  Patients are able to view lab/test results, encounter notes, upcoming appointments, etc.  Non-urgent messages can be sent to your provider as well.   To learn more about what you can do with MyChart, go to [email or web address].    Your next  appointment:   Based on results  The format for your next appointment:   In Person  Provider:   Buford Dresser, MD      Signed, Miranda Dresser, MD PhD 06/24/2020   [place]

## 2020-06-24 NOTE — Patient Instructions (Signed)
Medication Instructions:  Your Physician recommend you continue on your current medication as directed.    *If you need a refill on your cardiac medications before your next appointment, please call your pharmacy*   Lab Work: None ordered   Testing/Procedures: CT Cardiac Score 1126 [place]: At [facility], you and your health needs are our priority.  As part of our continuing mission to provide you with exceptional heart care, we have created designated Provider Care Teams.  These Care Teams include your primary Cardiologist (physician) and Advanced Practice Providers (APPs -  Physician Assistants and Nurse Practitioners) who all work together to provide you with the care you need, when you need it.  We recommend signing up for the patient portal called "MyChart".  Sign up information is provided on this After Visit Summary.  MyChart is used to connect with patients for Virtual Visits (Telemedicine).  Patients are able to view lab/test results, encounter notes, upcoming appointments, etc.  Non-urgent messages can be sent to your provider as well.   To learn more about what you can do with MyChart, go to [email or web address].    Your next appointment:   Based on results  The format for your next appointment:   In Person  Provider:   Buford Dresser, MD

## 2020-07-01 ENCOUNTER — Ambulatory Visit (INDEPENDENT_AMBULATORY_CARE_PROVIDER_SITE_OTHER)
Admission: RE | Admit: 2020-07-01 | Discharge: 2020-07-01 | Disposition: A | Discharge status: Home / Self Care | Payer: Self-pay | Source: Ambulatory Visit | Attending: Cardiology | Admitting: Cardiology

## 2020-07-01 ENCOUNTER — Other Ambulatory Visit: Payer: Self-pay

## 2020-07-01 DIAGNOSIS — Z8249 Family history of ischemic heart disease and other diseases of the circulatory system: Secondary | ICD-10-CM

## 2020-09-01 DIAGNOSIS — M8580 Other specified disorders of bone density and structure, unspecified site: Secondary | ICD-10-CM | POA: Insufficient documentation

## 2020-09-01 HISTORY — DX: Other specified disorders of bone density and structure, unspecified site: M85.80

## 2020-10-18 DIAGNOSIS — K219 Gastro-esophageal reflux disease without esophagitis: Secondary | ICD-10-CM | POA: Diagnosis not present

## 2020-10-18 DIAGNOSIS — E038 Other specified hypothyroidism: Secondary | ICD-10-CM | POA: Diagnosis not present

## 2020-10-18 DIAGNOSIS — M810 Age-related osteoporosis without current pathological fracture: Secondary | ICD-10-CM | POA: Diagnosis not present

## 2020-10-18 DIAGNOSIS — I10 Essential (primary) hypertension: Secondary | ICD-10-CM | POA: Diagnosis not present

## 2020-10-18 DIAGNOSIS — E890 Postprocedural hypothyroidism: Secondary | ICD-10-CM | POA: Diagnosis not present

## 2020-11-03 DIAGNOSIS — J019 Acute sinusitis, unspecified: Secondary | ICD-10-CM | POA: Diagnosis not present

## 2020-12-21 DIAGNOSIS — M810 Age-related osteoporosis without current pathological fracture: Secondary | ICD-10-CM | POA: Diagnosis not present

## 2020-12-21 DIAGNOSIS — E890 Postprocedural hypothyroidism: Secondary | ICD-10-CM | POA: Diagnosis not present

## 2020-12-21 DIAGNOSIS — E038 Other specified hypothyroidism: Secondary | ICD-10-CM | POA: Diagnosis not present

## 2020-12-21 DIAGNOSIS — K219 Gastro-esophageal reflux disease without esophagitis: Secondary | ICD-10-CM | POA: Diagnosis not present

## 2020-12-21 DIAGNOSIS — I10 Essential (primary) hypertension: Secondary | ICD-10-CM | POA: Diagnosis not present

## 2021-01-25 DIAGNOSIS — D2271 Melanocytic nevi of right lower limb, including hip: Secondary | ICD-10-CM | POA: Diagnosis not present

## 2021-01-25 DIAGNOSIS — D2272 Melanocytic nevi of left lower limb, including hip: Secondary | ICD-10-CM | POA: Diagnosis not present

## 2021-01-25 DIAGNOSIS — L814 Other melanin hyperpigmentation: Secondary | ICD-10-CM | POA: Diagnosis not present

## 2021-01-25 DIAGNOSIS — D2262 Melanocytic nevi of left upper limb, including shoulder: Secondary | ICD-10-CM | POA: Diagnosis not present

## 2021-01-25 DIAGNOSIS — L821 Other seborrheic keratosis: Secondary | ICD-10-CM | POA: Diagnosis not present

## 2021-01-25 DIAGNOSIS — D225 Melanocytic nevi of trunk: Secondary | ICD-10-CM | POA: Diagnosis not present

## 2021-01-25 DIAGNOSIS — D2261 Melanocytic nevi of right upper limb, including shoulder: Secondary | ICD-10-CM | POA: Diagnosis not present

## 2021-01-25 DIAGNOSIS — D224 Melanocytic nevi of scalp and neck: Secondary | ICD-10-CM | POA: Diagnosis not present

## 2021-03-02 DIAGNOSIS — H401222 Low-tension glaucoma, left eye, moderate stage: Secondary | ICD-10-CM | POA: Diagnosis not present

## 2021-03-02 DIAGNOSIS — H401211 Low-tension glaucoma, right eye, mild stage: Secondary | ICD-10-CM | POA: Diagnosis not present

## 2021-03-02 DIAGNOSIS — H04123 Dry eye syndrome of bilateral lacrimal glands: Secondary | ICD-10-CM | POA: Diagnosis not present

## 2021-03-02 DIAGNOSIS — H26491 Other secondary cataract, right eye: Secondary | ICD-10-CM | POA: Diagnosis not present

## 2021-03-07 DIAGNOSIS — M810 Age-related osteoporosis without current pathological fracture: Secondary | ICD-10-CM | POA: Diagnosis not present

## 2021-03-07 DIAGNOSIS — M8580 Other specified disorders of bone density and structure, unspecified site: Secondary | ICD-10-CM | POA: Diagnosis not present

## 2021-03-09 DIAGNOSIS — M810 Age-related osteoporosis without current pathological fracture: Secondary | ICD-10-CM | POA: Diagnosis not present

## 2021-04-21 DIAGNOSIS — Z1231 Encounter for screening mammogram for malignant neoplasm of breast: Secondary | ICD-10-CM | POA: Diagnosis not present

## 2021-04-27 DIAGNOSIS — E890 Postprocedural hypothyroidism: Secondary | ICD-10-CM | POA: Diagnosis not present

## 2021-04-27 DIAGNOSIS — I10 Essential (primary) hypertension: Secondary | ICD-10-CM | POA: Diagnosis not present

## 2021-04-27 DIAGNOSIS — Z13220 Encounter for screening for lipoid disorders: Secondary | ICD-10-CM | POA: Diagnosis not present

## 2021-04-27 DIAGNOSIS — E559 Vitamin D deficiency, unspecified: Secondary | ICD-10-CM | POA: Diagnosis not present

## 2021-04-27 DIAGNOSIS — Z136 Encounter for screening for cardiovascular disorders: Secondary | ICD-10-CM | POA: Diagnosis not present

## 2021-05-05 DIAGNOSIS — R21 Rash and other nonspecific skin eruption: Secondary | ICD-10-CM | POA: Diagnosis not present

## 2021-05-05 DIAGNOSIS — S80862A Insect bite (nonvenomous), left lower leg, initial encounter: Secondary | ICD-10-CM | POA: Diagnosis not present

## 2021-05-05 DIAGNOSIS — W57XXXA Bitten or stung by nonvenomous insect and other nonvenomous arthropods, initial encounter: Secondary | ICD-10-CM | POA: Diagnosis not present

## 2021-05-05 DIAGNOSIS — L089 Local infection of the skin and subcutaneous tissue, unspecified: Secondary | ICD-10-CM | POA: Diagnosis not present

## 2021-05-09 DIAGNOSIS — E559 Vitamin D deficiency, unspecified: Secondary | ICD-10-CM | POA: Diagnosis not present

## 2021-05-09 DIAGNOSIS — K219 Gastro-esophageal reflux disease without esophagitis: Secondary | ICD-10-CM | POA: Diagnosis not present

## 2021-05-09 DIAGNOSIS — I10 Essential (primary) hypertension: Secondary | ICD-10-CM | POA: Diagnosis not present

## 2021-05-09 DIAGNOSIS — E890 Postprocedural hypothyroidism: Secondary | ICD-10-CM | POA: Diagnosis not present

## 2021-05-09 DIAGNOSIS — Z0000 Encounter for general adult medical examination without abnormal findings: Secondary | ICD-10-CM | POA: Diagnosis not present

## 2021-05-30 ENCOUNTER — Other Ambulatory Visit: Payer: Self-pay

## 2021-05-30 ENCOUNTER — Encounter (HOSPITAL_BASED_OUTPATIENT_CLINIC_OR_DEPARTMENT_OTHER): Payer: Self-pay | Admitting: Cardiology

## 2021-05-30 ENCOUNTER — Ambulatory Visit (HOSPITAL_BASED_OUTPATIENT_CLINIC_OR_DEPARTMENT_OTHER): Payer: Medicare Other | Admitting: Cardiology

## 2021-05-30 VITALS — BP 150/100 | HR 68 | Ht 65.5 in | Wt 169.2 lb

## 2021-05-30 DIAGNOSIS — Z79899 Other long term (current) drug therapy: Secondary | ICD-10-CM

## 2021-05-30 DIAGNOSIS — Z8249 Family history of ischemic heart disease and other diseases of the circulatory system: Secondary | ICD-10-CM | POA: Diagnosis not present

## 2021-05-30 DIAGNOSIS — I10 Essential (primary) hypertension: Secondary | ICD-10-CM | POA: Diagnosis not present

## 2021-05-30 DIAGNOSIS — R002 Palpitations: Secondary | ICD-10-CM

## 2021-05-30 DIAGNOSIS — Z7189 Other specified counseling: Secondary | ICD-10-CM | POA: Diagnosis not present

## 2021-05-30 MED ORDER — SPIRONOLACTONE 25 MG PO TABS: 25.0000 mg | ORAL_TABLET | Freq: Every day | ORAL | 3 refills | Status: DC

## 2021-05-30 NOTE — Patient Instructions (Addendum)
Medication Instructions:  Start: Spironolactone 25 mg daily  Goal blood pressure is <130/80. If you stay 140-150 on your top number with the spironolactone, please call the office and we will make additional changes.  *If you need a refill on your cardiac medications before your next appointment, please call your pharmacy*   Lab Work: Your physician recommends that you return for lab work in 2 weeks (BMP).  If you have labs (blood work) drawn today and your tests are completely normal, you will receive your results only by: [place] (if you have MyChart) OR A paper copy in the mail If you have any lab test that is abnormal or we need to change your treatment, we will call you to review the results.   Testing/Procedures: None ordered today   Follow-Up: At [facility], you and your health needs are our priority.  As part of our continuing mission to provide you with exceptional heart care, we have created designated Provider Care Teams.  These Care Teams include your primary Cardiologist (physician) and Advanced Practice Providers (APPs -  Physician Assistants and Nurse Practitioners) who all work together to provide you with the care you need, when you need it.  We recommend signing up for the patient portal called "MyChart".  Sign up information is provided on this After Visit Summary.  MyChart is used to connect with patients for Virtual Visits (Telemedicine).  Patients are able to view lab/test results, encounter notes, upcoming appointments, etc.  Non-urgent messages can be sent to your provider as well.   To learn more about what you can do with MyChart, go to [email or web address].    Your next appointment:   1 year(s)  The format for your next appointment:   In Person  Provider:   Buford Dresser, MD

## 2021-05-30 NOTE — Progress Notes (Signed)
Cardiology Office Note:    Date:  05/30/2021   ID:  Miranda Russell, Miranda Russell 1946-07-07, MRN [id number]  PCP:  Lawerance Cruel, MD  Cardiologist:  Buford Dresser, MD  Referring MD: Lawerance Cruel, MD   CC: follow up  History of Present Illness:    Miranda Russell is a 75 y.o. female with a hx of hypertension who is seen for follow up today. I initially met her 06/24/20 as a new consult at the request of Lawerance Cruel, MD for the evaluation and management of progressive weakness, family history of CV disease and hypertension.  Family history: mother was one of 31 kids. 8 of them died of heart issues, 8 died of cancer. Mother died age 73 of heart failure. No premature heart disease that she knows of. Father had aortic enlargement. One brother with COPD/lung cancer, sister COPD. Both were smokers.  Today: Was walking into a store, felt weak, felt like her heart was racing. Got a cart to hold onto, felt better after a few minutes. Was a really hot day, but she had been eating/drinking well.   Does feel like she tires easily. Feels like when she wants to clean the house, she has to choose what she can do in a day. Falls asleep easily during the day, which has been going on for years. Husband says she snores, but her snoring has improved recently. Never had a sleep study.   Calcium score in 2021 was 0.  Blood pressure is elevated, but she had her granddaughter stayed with her and she had to get her to school. Checks at home, runs 145/80s. She went through a lot to get to the irbesartan and verapamil (takes 1/2 pill of verapamil). Discussed options today, she would rather keep her current medication and add another. Will trial spironolactone.  Denies chest pain, shortness of breath at rest. No PND, orthopnea, LE edema or unexpected weight gain. No syncope.   Past Medical History:  Diagnosis Date   Arthritis    Colon polyp    Epistaxis    Fibroids    GERD (gastroesophageal reflux  disease)    Glaucoma    Hypertension    Hypothyroidism    had thyroid removed   Murmur    " innocent murmur"    Peptic ulcer disease    PONV (postoperative nausea and vomiting)    Splenic mass    Wears glasses     Past Surgical History:  Procedure Laterality Date   ABDOMINAL HYSTERECTOMY     APPENDECTOMY     BREAST SURGERY     CATARACT EXTRACTION W/ INTRAOCULAR LENS  IMPLANT, BILATERAL     CHOLECYSTECTOMY     DENTAL SURGERY     permanent bridges   HERNIA REPAIR     KNEE SURGERY     LAPAROSCOPIC SPLENECTOMY N/A 11/26/2019   Procedure: LAPAROSCOPIC SPLENECTOMY;  Surgeon: Stark Klein, MD;  Location: [place];  Service: General;  Laterality: N/A;   THYROIDECTOMY     WISDOM TOOTH EXTRACTION      Current Medications: Current Outpatient Medications on File Prior to Visit  Medication Sig   acetaminophen (TYLENOL) 500 MG tablet Take 500-1,000 mg by mouth every 6 (six) hours as needed (for pain.).   denosumab (PROLIA) 60 MG/ML SOSY injection Inject 60 mg into the skin every 6 (six) months.   irbesartan (AVAPRO) 300 MG tablet Take 300 mg by mouth daily.   latanoprost (XALATAN) 0.005 % ophthalmic solution Place 1  drop into both eyes at bedtime.   levothyroxine (SYNTHROID) 137 MCG tablet Take 137 mcg by mouth daily before breakfast.   Multiple Vitamin (MULTIVITAMIN WITH MINERALS) TABS tablet Take 1 tablet by mouth daily with lunch. Alive for Women 50+   omeprazole (PRILOSEC) 20 MG capsule Take 20 mg by mouth daily before supper.    timolol (TIMOPTIC) 0.5 % ophthalmic solution Place 1 drop into both eyes in the morning and at bedtime.   verapamil (CALAN-SR) 240 MG CR tablet Take 120 mg by mouth daily.    Vitamin D, Ergocalciferol, (DRISDOL) 1.25 MG (50000 UT) CAPS capsule Take 50,000 Units by mouth every Monday.    No current facility-administered medications on file prior to visit.     Allergies:   Morphine and related, Codeine, and Keppra [levetiracetam]   Social History    Tobacco Use   Smoking status: Never   Smokeless tobacco: Never  Vaping Use   Vaping Use: Never used  Substance Use Topics   Alcohol use: Yes    Comment: occ   Drug use: Never    Family History: family history includes COPD in her brother, father, and sister; Coronary artery disease in her maternal grandfather; Dementia in her father; Heart failure in her maternal aunt and mother; Lung cancer in her brother and maternal aunt. There is no history of Breast cancer.  ROS:   Please see the history of present illness.  Additional pertinent ROS otherwise unremarkable.     EKGs/Labs/Other Studies Reviewed:    The following studies were reviewed today: Calcium score 06/2020=0 Carotid study from 2010  EKG:  EKG is personally reviewed.   05/30/21: SR with 1st degree AV block, RBBB, 68 bpm  Recent Labs: No results found for requested labs within last 8760 hours.  Recent Lipid Panel No results found for: CHOL, TRIG, HDL, CHOLHDL, VLDL, LDLCALC, LDLDIRECT  Physical Exam:    VS:  BP (!) 150/100   Pulse 68   Ht 5' 5.5" (1.664 m)   Wt 169 lb 3.2 oz (76.7 kg)   SpO2 95%   BMI 27.73 kg/m     Wt Readings from Last 3 Encounters:  05/30/21 169 lb 3.2 oz (76.7 kg)  06/24/20 167 lb 3.2 oz (75.8 kg)  11/26/19 169 lb 8 oz (76.9 kg)    GEN: Well nourished, well developed in no acute distress HEENT: Normal, moist mucous membranes NECK: No JVD CARDIAC: regular rhythm, normal S1 and S2, no rubs or gallops. No murmur. VASCULAR: Radial and DP pulses 2+ bilaterally. No carotid bruits RESPIRATORY:  Clear to auscultation without rales, wheezing or rhonchi  ABDOMEN: Soft, non-tender, non-distended MUSCULOSKELETAL:  Ambulates independently SKIN: Warm and dry, no edema NEUROLOGIC:  Alert and oriented x 3. No focal neuro deficits noted. PSYCHIATRIC:  Normal affect    ASSESSMENT:    1. Essential hypertension   2. Heart palpitations   3. Family history of heart disease   4. Cardiac risk  counseling   5. Counseling on health promotion and disease prevention   6. Medication management     PLAN:    Family history of CV disease CV risk assessment -calcium score 0. Asc Ao is 40 mm. This is different than the dedicated CT chest she had previously, which noted aortic atherosclerosis and LCx CAD. She is monitored for mediastinal mass, Ao size can be followed on these studies. -no indication for statin based on calcium score of 0 -discussed red flag warning signs that need immediate medical attention  Hypertension -continue irbesartan and verapamil. It took time to find a regimen that worked, and she would rather continue these instead of changing verapamil to amlodipine -adding spironolactone today as she is over BP goal -recheck BMET 2 weeks -she will monitor home BP. Goal <130/80. She will contact me if it remains elevated  Palpitations -infrequent, continue to monitor  Cardiac risk counseling and prevention recommendations: -recommend heart healthy/Mediterranean diet, with whole grains, fruits, vegetable, fish, lean meats, nuts, and olive oil. Limit salt. -recommend moderate walking, 3-5 times/week for 30-50 minutes each session. Aim for at least 150 minutes.week. Goal should be pace of 3 miles/hours, or walking 1.5 miles in 30 minutes -recommend avoidance of tobacco products. Avoid excess alcohol.  Plan for follow up: 1 year or sooner as needed  Buford Dresser, MD, PhD [place]  [facility]    Medication Adjustments/Labs and Tests Ordered: Current medicines are reviewed at length with the patient today.  Concerns regarding medicines are outlined above.  Orders Placed This Encounter  Procedures   Basic metabolic panel   EKG 10-FBPZ    Meds ordered this encounter  Medications   spironolactone (ALDACTONE) 25 MG tablet    Sig: Take 1 tablet (25 mg total) by mouth daily.    Dispense:  90 tablet    Refill:  3     Patient Instructions  Medication  Instructions:  Start: Spironolactone 25 mg daily  Goal blood pressure is <130/80. If you stay 140-150 on your top number with the spironolactone, please call the office and we will make additional changes.  *If you need a refill on your cardiac medications before your next appointment, please call your pharmacy*   Lab Work: Your physician recommends that you return for lab work in 2 weeks (BMP).  If you have labs (blood work) drawn today and your tests are completely normal, you will receive your results only by: [place] (if you have MyChart) OR A paper copy in the mail If you have any lab test that is abnormal or we need to change your treatment, we will call you to review the results.   Testing/Procedures: None ordered today   Follow-Up: At [facility], you and your health needs are our priority.  As part of our continuing mission to provide you with exceptional heart care, we have created designated Provider Care Teams.  These Care Teams include your primary Cardiologist (physician) and Advanced Practice Providers (APPs -  Physician Assistants and Nurse Practitioners) who all work together to provide you with the care you need, when you need it.  We recommend signing up for the patient portal called "MyChart".  Sign up information is provided on this After Visit Summary.  MyChart is used to connect with patients for Virtual Visits (Telemedicine).  Patients are able to view lab/test results, encounter notes, upcoming appointments, etc.  Non-urgent messages can be sent to your provider as well.   To learn more about what you can do with MyChart, go to [email or web address].    Your next appointment:   1 year(s)  The format for your next appointment:   In Person  Provider:   Buford Dresser, MD    Signed, Buford Dresser, MD PhD 05/30/2021   [place]

## 2021-06-04 IMAGING — MR MRI ABDOMEN WITH AND WITHOUT CONTRAST
16 of 17 series · 44 of 48 positions shown · IV contrast (gadavist)
Comparison: 08/30/2016

CLINICAL DATA: Liver and splenic lesions.

EXAM:
MRI ABDOMEN WITHOUT AND WITH CONTRAST
TECHNIQUE: Multiplanar multisequence MR imaging of the abdomen was performed
both before and after the administration of intravenous contrast.
CONTRAST:  7.5 cc Gadavist

[Series 2: cor ssfse / · coronal · 7.0mm · 1.48mm/px · 1 of 32 slices shown]
[im 1/32]
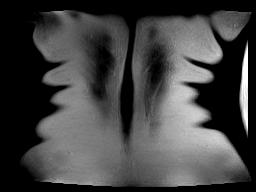

[Series 6: T2 · axial · 6.0mm · 1.48mm/px · z∈[-122,+159]mm · 2 of 40 slices shown]
[im 1/40]
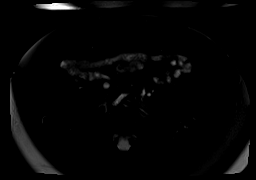
[im 40/40]
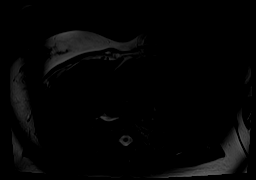

[Series 9: T1 · axial · 6.0mm · 0.74mm/px · z∈[-122,+159]mm · 3 of 80 slices shown]
[im 1/80]
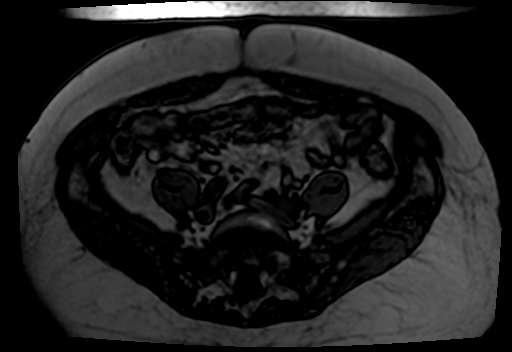
[im 40/80]
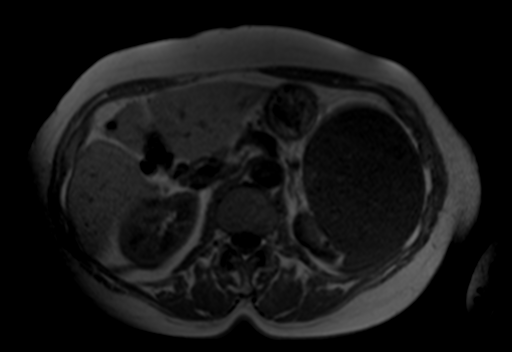
[im 80/80]
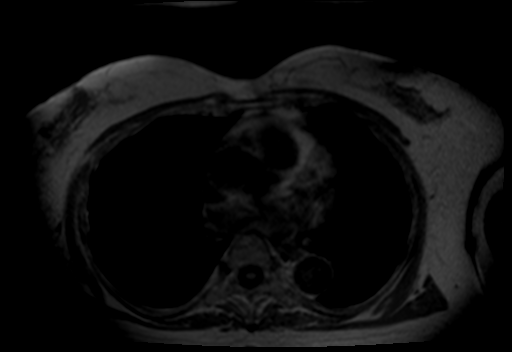

[Series 11: DWI · axial · 6.0mm · 2.00mm/px · z∈[-76,+205]mm · 5 of 120 slices shown (1 of 2)]
[im 1/120]
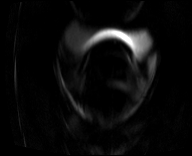
[im 30/120]
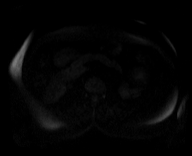
[im 60/120]
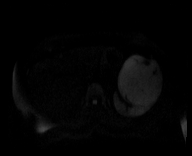
[im 90/120]
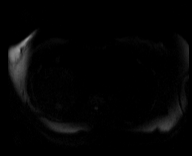
[im 120/120]
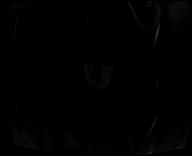

[Series 12: DWI · axial · 6.0mm · 2.00mm/px · z∈[-76,+205]mm · 2 of 40 slices shown (2 of 2)]
[im 1/40]
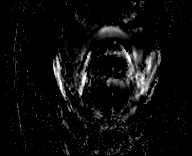
[im 40/40]
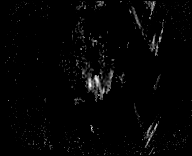

[Series 17: axial ssfse / · axial · 6.0mm · 1.19mm/px · z∈[-122,+159]mm · 2 of 40 slices shown]
[im 1/40]
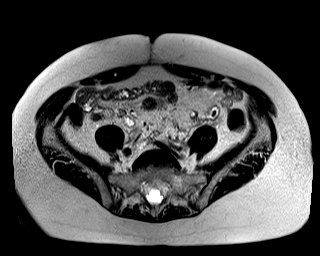
[im 40/40]
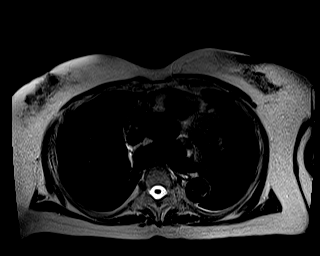

[Series 20: bSSFP · axial · 6.0mm · 0.74mm/px · z∈[-119,+162]mm · 2 of 40 slices shown]
[im 1/40]
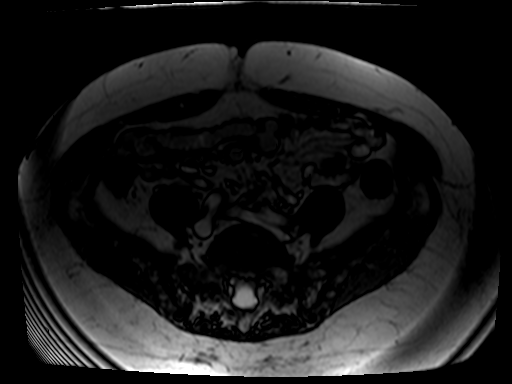
[im 40/40]
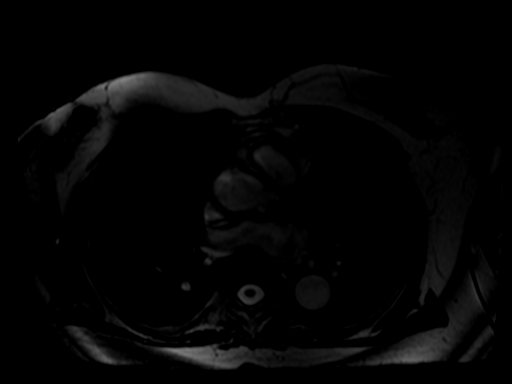

[Series 21: axial dynamic pre · axial · non-contrast · 3.0mm · 1.19mm/px · z∈[-76,+160]mm · 3 of 80 slices shown]
[im 1/80]
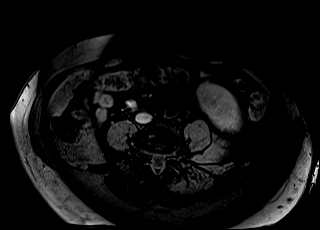
[im 40/80]
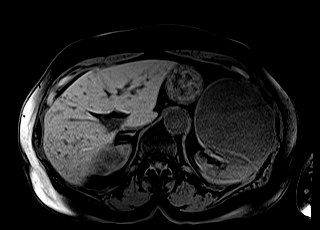
[im 80/80]
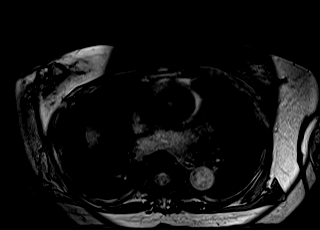

[Series 22: axial dynamic post · axial · 3.0mm · 1.19mm/px · z∈[-76,+160]mm · 3 of 80 slices shown (1 of 6)]
[im 1/80]
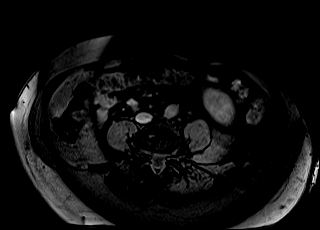
[im 40/80]
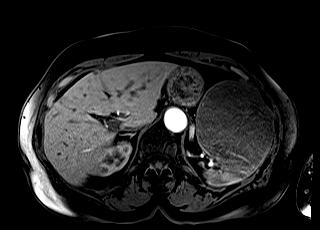
[im 80/80]
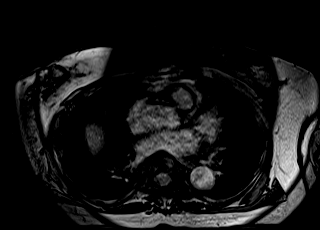

[Series 23: axial dynamic post · axial · 3.0mm · 1.19mm/px · z∈[-76,+160]mm · 3 of 80 slices shown (2 of 6)]
[im 1/80]
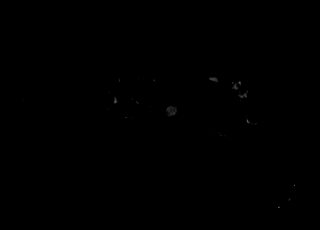
[im 40/80]
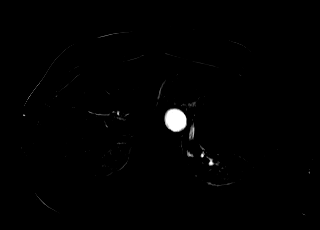
[im 80/80]
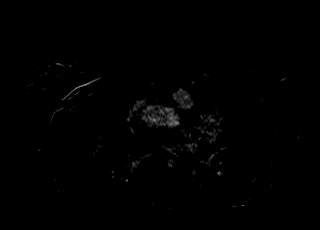

[Series 24: axial dynamic post · axial · 3.0mm · 1.19mm/px · z∈[-76,+160]mm · 3 of 80 slices shown (3 of 6)]
[im 1/80]
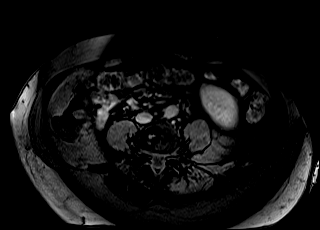
[im 40/80]
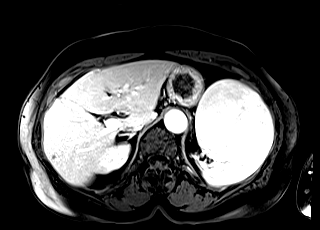
[im 80/80]
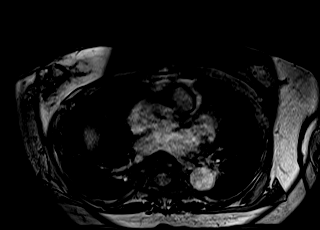

[Series 25: axial dynamic post · axial · 3.0mm · 1.19mm/px · z∈[-76,+160]mm · 3 of 80 slices shown (4 of 6)]
[im 1/80]
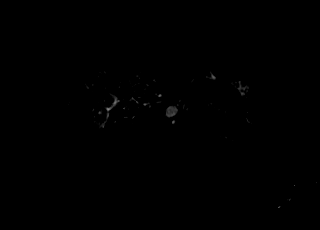
[im 40/80]
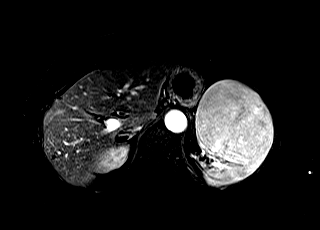
[im 80/80]
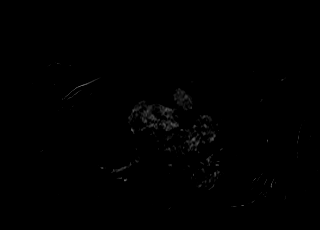

[Series 26: axial dynamic post · axial · 3.0mm · 1.19mm/px · z∈[-76,+160]mm · 3 of 80 slices shown (5 of 6)]
[im 1/80]
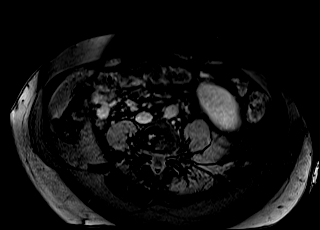
[im 40/80]
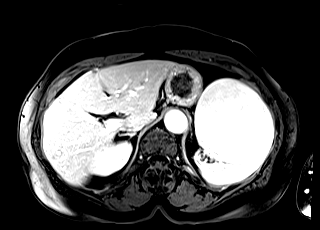
[im 80/80]
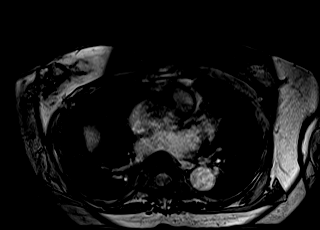

[Series 27: axial dynamic post · axial · 3.0mm · 1.19mm/px · z∈[-76,+160]mm · 3 of 80 slices shown (6 of 6)]
[im 1/80]
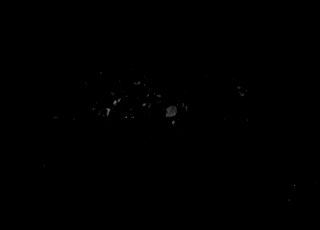
[im 40/80]
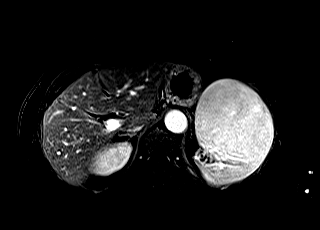
[im 80/80]
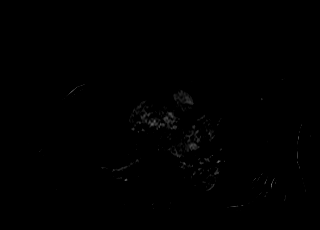

[Series 29: axial dynamic delayed · axial · 3.0mm · 1.19mm/px · z∈[-76,+160]mm · 3 of 80 slices shown]
[im 1/80]
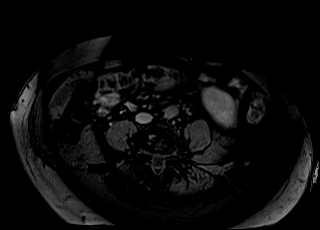
[im 40/80]
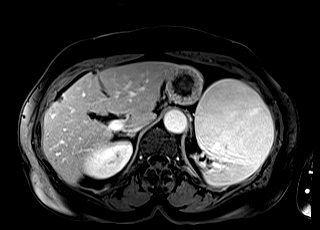
[im 80/80]
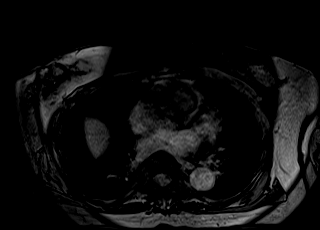

[Series 30: axial dynamic delayed_sub · axial · 3.0mm · 1.19mm/px · z∈[-76,+160]mm · 3 of 80 slices shown]
[im 1/80]
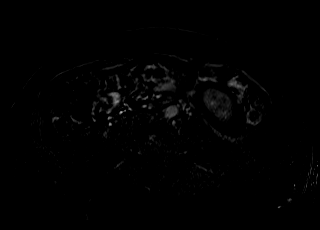
[im 40/80]
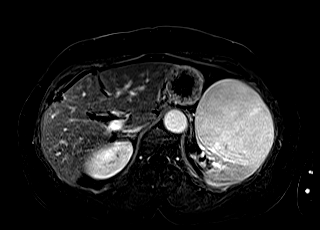
[im 80/80]
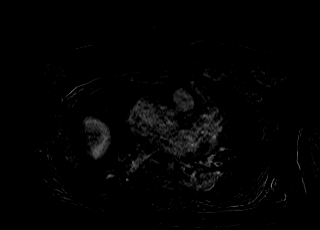

[44 of 48 positions shown; findings below may reference images not displayed]

FINDINGS: Lower chest: Unremarkable

Hepatobiliary: Multiple hepatic cysts again noted measuring up to
3.5 cm. Gallbladder is surgically absent. No intrahepatic or
extrahepatic biliary dilation.

Pancreas: No focal mass lesion. No dilatation of the main duct. No
intraparenchymal cyst. No peripancreatic edema.

Spleen: The enhancing splenic mass measures slightly larger on
today's exam at [DATE] x 9.6 x 11.2 cm compared to 10.0 x 8.6 x
cm previously. Enhancing dynamics are similar to background splenic
parenchyma. The

Adrenals/Urinary Tract: No adrenal nodule or mass. Kidneys
unremarkable

Stomach/Bowel: No gastric or bowel dilatation within the visualized
abdomen.

Vascular/Lymphatic: No abdominal aortic aneurysm. No abdominal
lymphadenopathy

Other:  No intraperitoneal free fluid.

Musculoskeletal: No abnormal marrow enhancement within the
visualized bony anatomy.
IMPRESSION: 1. Slight progression in the large enhancing splenic mass since
08/30/2016. Imaging features are nonspecific, but given relatively
modest increase in size over the more than 2 year interval, benign
etiology (i.e. Hamartoma) is favored although lymphoma or sarcoma
not entirely excluded. Continued close attention recommended.
2. Hepatic cysts.

## 2021-06-23 DIAGNOSIS — R1011 Right upper quadrant pain: Secondary | ICD-10-CM | POA: Diagnosis not present

## 2021-06-23 DIAGNOSIS — Z79899 Other long term (current) drug therapy: Secondary | ICD-10-CM | POA: Diagnosis not present

## 2021-06-23 DIAGNOSIS — R140 Abdominal distension (gaseous): Secondary | ICD-10-CM | POA: Diagnosis not present

## 2021-06-23 DIAGNOSIS — R110 Nausea: Secondary | ICD-10-CM | POA: Diagnosis not present

## 2021-06-23 DIAGNOSIS — R1319 Other dysphagia: Secondary | ICD-10-CM | POA: Diagnosis not present

## 2021-06-23 DIAGNOSIS — Z86010 Personal history of colonic polyps: Secondary | ICD-10-CM | POA: Diagnosis not present

## 2021-06-23 DIAGNOSIS — K219 Gastro-esophageal reflux disease without esophagitis: Secondary | ICD-10-CM | POA: Diagnosis not present

## 2021-06-23 LAB — BASIC METABOLIC PANEL
BUN/Creatinine Ratio: 14 (ref 12–28)
BUN: 10 mg/dL (ref 8–27)
CO2: 27 mmol/L (ref 20–29)
Calcium: 10.3 mg/dL (ref 8.7–10.3)
Chloride: 100 mmol/L (ref 96–106)
Creatinine, Ser: 0.7 mg/dL (ref 0.57–1.00)
Glucose: 69 mg/dL (ref 65–99)
Potassium: 5.2 mmol/L (ref 3.5–5.2)
Sodium: 141 mmol/L (ref 134–144)
eGFR: 91 mL/min/{1.73_m2} (ref >59)

## 2021-06-25 DIAGNOSIS — R55 Syncope and collapse: Secondary | ICD-10-CM | POA: Diagnosis not present

## 2021-06-25 DIAGNOSIS — T679XXA Effect of heat and light, unspecified, initial encounter: Secondary | ICD-10-CM | POA: Diagnosis not present

## 2021-06-25 DIAGNOSIS — R1111 Vomiting without nausea: Secondary | ICD-10-CM | POA: Diagnosis not present

## 2021-06-25 DIAGNOSIS — S0990XA Unspecified injury of head, initial encounter: Secondary | ICD-10-CM | POA: Diagnosis not present

## 2021-06-27 ENCOUNTER — Telehealth: Payer: Self-pay | Admitting: Cardiology

## 2021-06-27 DIAGNOSIS — I10 Essential (primary) hypertension: Secondary | ICD-10-CM

## 2021-06-27 NOTE — Telephone Encounter (Signed)
Pt c/o medication issue:  1. Name of Medication:  spironolactone (ALDACTONE) 25 MG tablet  2. How are you currently taking this medication (dosage and times per day)?  As prescribed  3. Are you having a reaction (difficulty breathing--STAT)?  See below   4. What is your medication issue?   Patient states on the night of 06/06/2021 she was at a family birthday dinner and after she finished eating, her family noticed she became extremely pale. She states she drank about 16 oz of water and then started vomiting violently and became clammy.  She states EMS was called and when they arrived they performed an EKG and took her BP. Sitting was 106/68. Standing was 112/76. She states she feels better now, but she assumes this new medication may have been the cause of her episode Saturday night.

## 2021-06-27 NOTE — Telephone Encounter (Signed)
Returned call to patient who reports the episode that occurred on the evening of 9/24 and feeling weak since that time. She denies vomiting since 9/24 but continues to have increased belching. She noted several days of nausea prior to 9/24. BP on 9/25 was:  @ 1:01 pm 131/90, HR 77 @ 1:03 122/83, HR 74 Today @ 1130 BP 136/89, HR 72 She did not notice any symptoms of nausea immediately after starting spironolactone and denies dizziness/lightheadedness. She takes spironolactone, verapamil, and irbesartan in the mornings. Advised that she could take one of these medications in the evening to avoid possible hypotension. She had bmet on 9/22 and her K+ is 5.2, which is upper limit of normal. Admits to diet that his high in fresh fruits and vegetables. I advised that I will forward message to Dr. Harrell Gave for advice and that someone from our office will call her back with that information. She verbalized understanding and agreement and thanked me for the call.

## 2021-06-27 NOTE — Telephone Encounter (Signed)
I would hold the spironolactone for a week and see how she feels. If the symptoms resolve, I would restart 1/2 pill of spironolactone daily and see how she feels. I would then recheck bmet 2 weeks after restarting spironolactone. Goal is to keep blood pressure <130/80. We can have her come back and see me or caitlin in 4-6 weeks. If the nausea recurs on spironolactone, I would just stop it until we can see her in follow up. Thanks.

## 2021-06-28 MED ORDER — SPIRONOLACTONE 25 MG PO TABS: 25.0000 mg | ORAL_TABLET | Freq: Every day | ORAL | 3 refills | Status: DC

## 2021-06-28 NOTE — Telephone Encounter (Signed)
Called patient and reviewed Dr.Christopher's advice. She agrees to hold spironolactone for 1 week. She will call back to let us know how she feels and agrees to restart 1/2 tab (12.5 mg) if symptoms resolve. At that time, we will schedule BMET and follow-up appointment. Patient thanked me for the call.

## 2021-06-29 DIAGNOSIS — R110 Nausea: Secondary | ICD-10-CM | POA: Diagnosis not present

## 2021-06-29 DIAGNOSIS — K3189 Other diseases of stomach and duodenum: Secondary | ICD-10-CM | POA: Diagnosis not present

## 2021-06-29 DIAGNOSIS — R1310 Dysphagia, unspecified: Secondary | ICD-10-CM | POA: Diagnosis not present

## 2021-06-29 DIAGNOSIS — K2930 Chronic superficial gastritis without bleeding: Secondary | ICD-10-CM | POA: Diagnosis not present

## 2021-06-29 DIAGNOSIS — R140 Abdominal distension (gaseous): Secondary | ICD-10-CM | POA: Diagnosis not present

## 2021-06-29 DIAGNOSIS — R1011 Right upper quadrant pain: Secondary | ICD-10-CM | POA: Diagnosis not present

## 2021-07-02 DIAGNOSIS — 419620001 Death: Secondary | SNOMED CT | POA: Diagnosis not present

## 2021-07-02 DEATH — deceased

## 2021-07-05 DIAGNOSIS — K2930 Chronic superficial gastritis without bleeding: Secondary | ICD-10-CM | POA: Diagnosis not present

## 2021-07-12 ENCOUNTER — Telehealth (HOSPITAL_BASED_OUTPATIENT_CLINIC_OR_DEPARTMENT_OTHER): Payer: Self-pay | Admitting: Cardiology

## 2021-07-12 DIAGNOSIS — I10 Essential (primary) hypertension: Secondary | ICD-10-CM

## 2021-07-12 DIAGNOSIS — Z79899 Other long term (current) drug therapy: Secondary | ICD-10-CM

## 2021-07-12 MED ORDER — SPIRONOLACTONE 25 MG PO TABS: 12.5000 mg | ORAL_TABLET | Freq: Every day | ORAL | 3 refills | Status: DC

## 2021-07-12 NOTE — Telephone Encounter (Signed)
Pt c/o medication issue:  1. Name of Medication: spironolactone (ALDACTONE) 25 MG tablet  2. How are you currently taking this medication (dosage and times per day)? Take 1 tablet (25 mg total) by mouth daily  3. Are you having a reaction (difficulty breathing--STAT)? no  4. What is your medication issue? Patient was calling in to the dr know that she has started taking the medication again. So that blood work can be done for her in 10 days. Please advise

## 2021-07-12 NOTE — Telephone Encounter (Signed)
Spoke with pt, she has restarted the spironolactone 12.5 mg daily. Lab orders mailed to the pt

## 2021-07-13 ENCOUNTER — Ambulatory Visit (HOSPITAL_BASED_OUTPATIENT_CLINIC_OR_DEPARTMENT_OTHER): Payer: Medicare Other | Admitting: Cardiology

## 2021-07-19 DIAGNOSIS — Z79899 Other long term (current) drug therapy: Secondary | ICD-10-CM | POA: Diagnosis not present

## 2021-07-19 DIAGNOSIS — I10 Essential (primary) hypertension: Secondary | ICD-10-CM | POA: Diagnosis not present

## 2021-07-20 LAB — BASIC METABOLIC PANEL
BUN/Creatinine Ratio: 11 — ABNORMAL LOW (ref 12–28)
BUN: 8 mg/dL (ref 8–27)
CO2: 26 mmol/L (ref 20–29)
Calcium: 9.5 mg/dL (ref 8.7–10.3)
Chloride: 103 mmol/L (ref 96–106)
Creatinine, Ser: 0.7 mg/dL (ref 0.57–1.00)
Glucose: 104 mg/dL — ABNORMAL HIGH (ref 70–99)
Potassium: 5.1 mmol/L (ref 3.5–5.2)
Sodium: 144 mmol/L (ref 134–144)
eGFR: 91 mL/min/{1.73_m2} (ref >59)

## 2021-07-22 ENCOUNTER — Telehealth (HOSPITAL_BASED_OUTPATIENT_CLINIC_OR_DEPARTMENT_OTHER): Payer: Self-pay | Admitting: Cardiology

## 2021-07-22 NOTE — Telephone Encounter (Signed)
Patient calling in regards to test results. Please advise

## 2021-07-22 NOTE — Telephone Encounter (Signed)
Pt aware labs have not been reviewed as of yet Will call once Dr Harrell Gave reviews .Adonis Housekeeper

## 2021-07-28 DIAGNOSIS — R110 Nausea: Secondary | ICD-10-CM | POA: Diagnosis not present

## 2021-07-28 DIAGNOSIS — R140 Abdominal distension (gaseous): Secondary | ICD-10-CM | POA: Diagnosis not present

## 2021-07-28 DIAGNOSIS — R1319 Other dysphagia: Secondary | ICD-10-CM | POA: Diagnosis not present

## 2021-07-28 DIAGNOSIS — K120 Recurrent oral aphthae: Secondary | ICD-10-CM | POA: Diagnosis not present

## 2021-08-09 ENCOUNTER — Encounter (HOSPITAL_BASED_OUTPATIENT_CLINIC_OR_DEPARTMENT_OTHER): Payer: Self-pay

## 2021-08-30 DIAGNOSIS — M8580 Other specified disorders of bone density and structure, unspecified site: Secondary | ICD-10-CM | POA: Diagnosis not present

## 2021-08-31 DIAGNOSIS — H401211 Low-tension glaucoma, right eye, mild stage: Secondary | ICD-10-CM | POA: Diagnosis not present

## 2021-08-31 DIAGNOSIS — H401222 Low-tension glaucoma, left eye, moderate stage: Secondary | ICD-10-CM | POA: Diagnosis not present

## 2021-09-19 DIAGNOSIS — M810 Age-related osteoporosis without current pathological fracture: Secondary | ICD-10-CM | POA: Diagnosis not present

## 2021-10-26 DIAGNOSIS — I10 Essential (primary) hypertension: Secondary | ICD-10-CM | POA: Diagnosis not present

## 2021-10-26 DIAGNOSIS — E038 Other specified hypothyroidism: Secondary | ICD-10-CM | POA: Diagnosis not present

## 2021-10-26 DIAGNOSIS — K219 Gastro-esophageal reflux disease without esophagitis: Secondary | ICD-10-CM | POA: Diagnosis not present

## 2021-10-26 DIAGNOSIS — M810 Age-related osteoporosis without current pathological fracture: Secondary | ICD-10-CM | POA: Diagnosis not present

## 2021-10-26 DIAGNOSIS — E890 Postprocedural hypothyroidism: Secondary | ICD-10-CM | POA: Diagnosis not present

## 2021-12-06 DIAGNOSIS — D225 Melanocytic nevi of trunk: Secondary | ICD-10-CM | POA: Diagnosis not present

## 2021-12-06 DIAGNOSIS — L308 Other specified dermatitis: Secondary | ICD-10-CM | POA: Diagnosis not present

## 2021-12-06 DIAGNOSIS — L821 Other seborrheic keratosis: Secondary | ICD-10-CM | POA: Diagnosis not present

## 2021-12-27 DIAGNOSIS — R143 Flatulence: Secondary | ICD-10-CM | POA: Diagnosis not present

## 2022-01-24 DIAGNOSIS — R29890 Loss of height: Secondary | ICD-10-CM | POA: Diagnosis not present

## 2022-01-24 LAB — HM DEXA SCAN

## 2022-03-02 DIAGNOSIS — R0789 Other chest pain: Secondary | ICD-10-CM | POA: Diagnosis not present

## 2022-03-02 DIAGNOSIS — Z23 Encounter for immunization: Secondary | ICD-10-CM | POA: Diagnosis not present

## 2022-03-02 DIAGNOSIS — Z9081 Acquired absence of spleen: Secondary | ICD-10-CM | POA: Diagnosis not present

## 2022-03-02 DIAGNOSIS — R0609 Other forms of dyspnea: Secondary | ICD-10-CM | POA: Diagnosis not present

## 2022-03-02 DIAGNOSIS — R140 Abdominal distension (gaseous): Secondary | ICD-10-CM | POA: Diagnosis not present

## 2022-03-03 ENCOUNTER — Other Ambulatory Visit: Payer: Self-pay | Admitting: Family Medicine

## 2022-03-03 DIAGNOSIS — R140 Abdominal distension (gaseous): Secondary | ICD-10-CM

## 2022-03-09 ENCOUNTER — Ambulatory Visit
Admission: RE | Admit: 2022-03-09 | Discharge: 2022-03-09 | Disposition: A | Discharge status: Home / Self Care | Payer: Medicare Other | Source: Ambulatory Visit | Attending: Family Medicine | Admitting: Family Medicine

## 2022-03-09 DIAGNOSIS — R140 Abdominal distension (gaseous): Secondary | ICD-10-CM | POA: Diagnosis not present

## 2022-03-09 DIAGNOSIS — K7689 Other specified diseases of liver: Secondary | ICD-10-CM | POA: Diagnosis not present

## 2022-03-09 DIAGNOSIS — Z9081 Acquired absence of spleen: Secondary | ICD-10-CM | POA: Diagnosis not present

## 2022-03-09 DIAGNOSIS — Z9049 Acquired absence of other specified parts of digestive tract: Secondary | ICD-10-CM | POA: Diagnosis not present

## 2022-03-13 DIAGNOSIS — H401222 Low-tension glaucoma, left eye, moderate stage: Secondary | ICD-10-CM | POA: Diagnosis not present

## 2022-03-13 DIAGNOSIS — H401211 Low-tension glaucoma, right eye, mild stage: Secondary | ICD-10-CM | POA: Diagnosis not present

## 2022-03-13 DIAGNOSIS — H43813 Vitreous degeneration, bilateral: Secondary | ICD-10-CM | POA: Diagnosis not present

## 2022-03-13 DIAGNOSIS — H52203 Unspecified astigmatism, bilateral: Secondary | ICD-10-CM | POA: Diagnosis not present

## 2022-03-22 DIAGNOSIS — M8580 Other specified disorders of bone density and structure, unspecified site: Secondary | ICD-10-CM | POA: Diagnosis not present

## 2022-03-24 DIAGNOSIS — M810 Age-related osteoporosis without current pathological fracture: Secondary | ICD-10-CM | POA: Diagnosis not present

## 2022-04-12 DIAGNOSIS — K6289 Other specified diseases of anus and rectum: Secondary | ICD-10-CM | POA: Diagnosis not present

## 2022-04-12 DIAGNOSIS — Z09 Encounter for follow-up examination after completed treatment for conditions other than malignant neoplasm: Secondary | ICD-10-CM | POA: Diagnosis not present

## 2022-04-12 DIAGNOSIS — R1011 Right upper quadrant pain: Secondary | ICD-10-CM | POA: Diagnosis not present

## 2022-04-12 DIAGNOSIS — K644 Residual hemorrhoidal skin tags: Secondary | ICD-10-CM | POA: Diagnosis not present

## 2022-04-12 DIAGNOSIS — K648 Other hemorrhoids: Secondary | ICD-10-CM | POA: Diagnosis not present

## 2022-04-12 DIAGNOSIS — Z86010 Personal history of colonic polyps: Secondary | ICD-10-CM | POA: Diagnosis not present

## 2022-04-12 DIAGNOSIS — K319 Disease of stomach and duodenum, unspecified: Secondary | ICD-10-CM | POA: Diagnosis not present

## 2022-04-12 DIAGNOSIS — K635 Polyp of colon: Secondary | ICD-10-CM | POA: Diagnosis not present

## 2022-04-12 DIAGNOSIS — K5730 Diverticulosis of large intestine without perforation or abscess without bleeding: Secondary | ICD-10-CM | POA: Diagnosis not present

## 2022-04-14 DIAGNOSIS — K635 Polyp of colon: Secondary | ICD-10-CM | POA: Diagnosis not present

## 2022-04-14 DIAGNOSIS — K319 Disease of stomach and duodenum, unspecified: Secondary | ICD-10-CM | POA: Diagnosis not present

## 2022-04-26 DIAGNOSIS — Z1231 Encounter for screening mammogram for malignant neoplasm of breast: Secondary | ICD-10-CM | POA: Diagnosis not present

## 2022-05-12 DIAGNOSIS — L309 Dermatitis, unspecified: Secondary | ICD-10-CM | POA: Diagnosis not present

## 2022-05-12 DIAGNOSIS — L089 Local infection of the skin and subcutaneous tissue, unspecified: Secondary | ICD-10-CM | POA: Diagnosis not present

## 2022-05-17 DIAGNOSIS — I10 Essential (primary) hypertension: Secondary | ICD-10-CM | POA: Diagnosis not present

## 2022-05-17 DIAGNOSIS — E890 Postprocedural hypothyroidism: Secondary | ICD-10-CM | POA: Diagnosis not present

## 2022-05-17 DIAGNOSIS — E559 Vitamin D deficiency, unspecified: Secondary | ICD-10-CM | POA: Diagnosis not present

## 2022-05-24 DIAGNOSIS — I10 Essential (primary) hypertension: Secondary | ICD-10-CM | POA: Diagnosis not present

## 2022-05-24 DIAGNOSIS — E559 Vitamin D deficiency, unspecified: Secondary | ICD-10-CM | POA: Diagnosis not present

## 2022-05-24 DIAGNOSIS — Z0000 Encounter for general adult medical examination without abnormal findings: Secondary | ICD-10-CM | POA: Diagnosis not present

## 2022-05-24 DIAGNOSIS — K219 Gastro-esophageal reflux disease without esophagitis: Secondary | ICD-10-CM | POA: Diagnosis not present

## 2022-05-24 DIAGNOSIS — E890 Postprocedural hypothyroidism: Secondary | ICD-10-CM | POA: Diagnosis not present

## 2022-05-31 DIAGNOSIS — T781XXD Other adverse food reactions, not elsewhere classified, subsequent encounter: Secondary | ICD-10-CM | POA: Diagnosis not present

## 2022-05-31 DIAGNOSIS — R21 Rash and other nonspecific skin eruption: Secondary | ICD-10-CM | POA: Diagnosis not present

## 2022-05-31 DIAGNOSIS — J310 Chronic rhinitis: Secondary | ICD-10-CM | POA: Diagnosis not present

## 2022-06-08 DIAGNOSIS — T781XXA Other adverse food reactions, not elsewhere classified, initial encounter: Secondary | ICD-10-CM | POA: Diagnosis not present

## 2022-06-19 DIAGNOSIS — L814 Other melanin hyperpigmentation: Secondary | ICD-10-CM | POA: Diagnosis not present

## 2022-06-19 DIAGNOSIS — L309 Dermatitis, unspecified: Secondary | ICD-10-CM | POA: Diagnosis not present

## 2022-06-19 DIAGNOSIS — L0889 Other specified local infections of the skin and subcutaneous tissue: Secondary | ICD-10-CM | POA: Diagnosis not present

## 2022-06-19 DIAGNOSIS — L301 Dyshidrosis [pompholyx]: Secondary | ICD-10-CM | POA: Diagnosis not present

## 2022-06-19 DIAGNOSIS — D485 Neoplasm of uncertain behavior of skin: Secondary | ICD-10-CM | POA: Diagnosis not present

## 2022-06-27 DIAGNOSIS — R21 Rash and other nonspecific skin eruption: Secondary | ICD-10-CM | POA: Diagnosis not present

## 2022-06-27 DIAGNOSIS — E890 Postprocedural hypothyroidism: Secondary | ICD-10-CM | POA: Diagnosis not present

## 2022-08-30 DIAGNOSIS — M810 Age-related osteoporosis without current pathological fracture: Secondary | ICD-10-CM | POA: Diagnosis not present

## 2022-08-31 LAB — LAB REPORT - SCANNED
Calcium: 8.9
EGFR: 90

## 2022-09-06 DIAGNOSIS — M810 Age-related osteoporosis without current pathological fracture: Secondary | ICD-10-CM | POA: Diagnosis not present

## 2022-10-25 DIAGNOSIS — E890 Postprocedural hypothyroidism: Secondary | ICD-10-CM | POA: Diagnosis not present

## 2022-10-25 DIAGNOSIS — Z23 Encounter for immunization: Secondary | ICD-10-CM | POA: Diagnosis not present

## 2022-10-26 DIAGNOSIS — L300 Nummular dermatitis: Secondary | ICD-10-CM | POA: Diagnosis not present

## 2022-10-26 DIAGNOSIS — L821 Other seborrheic keratosis: Secondary | ICD-10-CM | POA: Diagnosis not present

## 2022-10-26 DIAGNOSIS — L820 Inflamed seborrheic keratosis: Secondary | ICD-10-CM | POA: Diagnosis not present

## 2022-11-07 DIAGNOSIS — R21 Rash and other nonspecific skin eruption: Secondary | ICD-10-CM | POA: Diagnosis not present

## 2022-11-21 DIAGNOSIS — H401232 Low-tension glaucoma, bilateral, moderate stage: Secondary | ICD-10-CM | POA: Diagnosis not present

## 2022-11-23 ENCOUNTER — Ambulatory Visit (HOSPITAL_BASED_OUTPATIENT_CLINIC_OR_DEPARTMENT_OTHER): Payer: Medicare Other | Admitting: Cardiology

## 2022-11-23 ENCOUNTER — Encounter (HOSPITAL_BASED_OUTPATIENT_CLINIC_OR_DEPARTMENT_OTHER): Payer: Self-pay | Admitting: Cardiology

## 2022-11-23 VITALS — BP 130/80 | HR 73 | Ht 65.5 in | Wt 174.0 lb

## 2022-11-23 DIAGNOSIS — Z8249 Family history of ischemic heart disease and other diseases of the circulatory system: Secondary | ICD-10-CM

## 2022-11-23 DIAGNOSIS — Z7189 Other specified counseling: Secondary | ICD-10-CM

## 2022-11-23 DIAGNOSIS — I10 Essential (primary) hypertension: Secondary | ICD-10-CM | POA: Diagnosis not present

## 2022-11-23 DIAGNOSIS — R002 Palpitations: Secondary | ICD-10-CM | POA: Diagnosis not present

## 2022-11-23 DIAGNOSIS — T7840XA Allergy, unspecified, initial encounter: Secondary | ICD-10-CM | POA: Insufficient documentation

## 2022-11-23 NOTE — Progress Notes (Signed)
Cardiology Office Note:    Date:  11/24/2022   ID:  Caffie, Gonsalez Aug 27, 1946, MRN [id number]  PCP:  Lawerance Cruel, MD  Cardiologist:  Buford Dresser, MD  Referring MD: Lawerance Cruel, MD   CC: follow up  History of Present Illness:    Miranda Russell is a 77 y.o. female with a hx of hypertension who is seen for follow up today. I initially met her 06/24/20 as a new consult at the request of Lawerance Cruel, MD for the evaluation and management of progressive weakness, family history of CV disease and hypertension.  Family history: mother was one of 56 kids. 8 of them died of heart issues, 8 died of cancer. Mother died age 11 of heart failure. No premature heart disease that she knows of. Father had aortic enlargement. One brother with COPD/lung cancer, sister COPD. Both were smokers.  Last visit she had an episode of palpitations when walking into a store that passed in a few minutes. She felt that she tires easily and has been having daytime somnolence, which has been ongoing for years. She does snore and has never had a sleep study. Her blood pressure at home usually runs around the 140's/80's  Today, the patient states that she has been doing well. She notes some shortness of breath when bending over. She gets dizzy when turning around quickly. She has some episodes of palpitations when turning over in bed but it passes quickly. She denies any syncope.  Her home BP is around the 130's over 70's to 80's which is similar to what it is in clinic today. She was intolerant of spirolactone and stopped it after seeing her PCP and her blood pressure has still been controlled since.  She had a cough for the whole month of January and had to stop and rest when walking up the incline of her driveway to the house. She is unsure if it has improved since she was sick but she notes that she is able to walk on flat surfaces. She still has some rhinitis and sputum production.  Her  husband states that she had been having trouble catching her breath when asleep. She has already signed up for a sleep study on her own which is next week.   She has had a rash for months.   She denies any chest pain, or peripheral edema. No headaches, syncope, orthopnea, or PND.  (+) SOB (+) Dizziness (+) Rhinitis (+) Sputum production (+) Palpitations  Past Medical History:  Diagnosis Date   Arthritis    Colon polyp    Epistaxis    Fibroids    GERD (gastroesophageal reflux disease)    Glaucoma    Hypertension    Hypothyroidism    had thyroid removed   Murmur    " innocent murmur"    Osteopenia 09/01/2020   Peptic ulcer disease    PONV (postoperative nausea and vomiting)    Recurrent epistaxis 11/21/2018   Splenic mass    Wears glasses     Past Surgical History:  Procedure Laterality Date   ABDOMINAL HYSTERECTOMY     APPENDECTOMY     BREAST SURGERY     CATARACT EXTRACTION W/ INTRAOCULAR LENS  IMPLANT, BILATERAL     CHOLECYSTECTOMY     DENTAL SURGERY     permanent bridges   HERNIA REPAIR     KNEE SURGERY     LAPAROSCOPIC SPLENECTOMY N/A 11/26/2019   Procedure: LAPAROSCOPIC SPLENECTOMY;  Surgeon: Barry Dienes,  Dorris Fetch, MD;  Location: [facility];  Service: General;  Laterality: N/A;   THYROIDECTOMY     WISDOM TOOTH EXTRACTION      Current Medications: Current Outpatient Medications on File Prior to Visit  Medication Sig   acetaminophen (TYLENOL) 500 MG tablet Take 500-1,000 mg by mouth every 6 (six) hours as needed (for pain.).   denosumab (PROLIA) 60 MG/ML SOSY injection Inject 60 mg into the skin every 6 (six) months.   irbesartan (AVAPRO) 300 MG tablet Take 300 mg by mouth daily.   latanoprost (XALATAN) 0.005 % ophthalmic solution Place 1 drop into both eyes at bedtime.   levothyroxine (SYNTHROID) 137 MCG tablet Take 137 mcg by mouth daily before breakfast.   Multiple Vitamin (MULTIVITAMIN WITH MINERALS) TABS tablet Take 1 tablet by mouth daily with lunch. Alive for Women  50+   omeprazole (PRILOSEC) 20 MG capsule Take 20 mg by mouth daily before supper.    timolol (TIMOPTIC) 0.5 % ophthalmic solution Place 1 drop into both eyes in the morning and at bedtime.   verapamil (CALAN-SR) 240 MG CR tablet Take 120 mg by mouth daily.    Vitamin D, Ergocalciferol, (DRISDOL) 1.25 MG (50000 UT) CAPS capsule Take 50,000 Units by mouth every Monday.    No current facility-administered medications on file prior to visit.     Allergies:   Morphine and related, Codeine, Amoxicillin-pot clavulanate, Keppra [levetiracetam], and Spironolactone   Social History   Tobacco Use   Smoking status: Never   Smokeless tobacco: Never  Vaping Use   Vaping Use: Never used  Substance Use Topics   Alcohol use: Yes    Comment: occ   Drug use: Never    Family History: family history includes COPD in her brother, father, and sister; Coronary artery disease in her maternal grandfather; Dementia in her father; Heart failure in her maternal aunt and mother; Lung cancer in her brother and maternal aunt. There is no history of Breast cancer.  ROS:   Please see the history of present illness.    Additional pertinent ROS otherwise unremarkable.     EKGs/Labs/Other Studies Reviewed:    The following studies were reviewed today: Calcium score 06/2020=0 Carotid study from 2010  EKG:  EKG is personally reviewed.   11/23/2022: SR with 1st degree AV block, RBBB, 73 bpm 05/30/21: SR with 1st degree AV block, RBBB, 68 bpm  Recent Labs: No results found for requested labs within last 365 days.  Recent Lipid Panel No results found for: "CHOL", "TRIG", "HDL", "CHOLHDL", "VLDL", "LDLCALC", "LDLDIRECT"  Physical Exam:    VS:  BP 130/80 (BP Location: Left Arm, Patient Position: Sitting, Cuff Size: Normal)   Pulse 73   Ht 5' 5.5" (1.664 m)   Wt 174 lb (78.9 kg)   SpO2 94%   BMI 28.51 kg/m     Wt Readings from Last 3 Encounters:  11/23/22 174 lb (78.9 kg)  05/30/21 169 lb 3.2 oz (76.7  kg)  06/24/20 167 lb 3.2 oz (75.8 kg)    GEN: Well nourished, well developed in no acute distress HEENT: Normal, moist mucous membranes NECK: No JVD CARDIAC: regular rhythm, normal S1 and S2, no rubs or gallops. No murmur. VASCULAR: Radial and DP pulses 2+ bilaterally. No carotid bruits RESPIRATORY:  Clear to auscultation without rales, wheezing or rhonchi  ABDOMEN: Soft, non-tender, non-distended MUSCULOSKELETAL:  Ambulates independently SKIN: Warm and dry, no edema NEUROLOGIC:  Alert and oriented x 3. No focal neuro deficits noted. PSYCHIATRIC:  Normal  affect    ASSESSMENT:    1. Heart palpitations   2. Essential hypertension   3. Family history of heart disease   4. Counseling on health promotion and disease prevention      PLAN:    Family history of CV disease CV risk assessment -calcium score 0. Asc Ao is 40 mm. This is different than the dedicated CT chest she had previously, which noted aortic atherosclerosis and LCx CAD. She is monitored for mediastinal mass, Ao size can be followed on these studies. -no indication for statin based on calcium score of 0 -discussed red flag warning signs that need immediate medical attention  Hypertension -continue irbesartan and verapamil. It took time to find a regimen that worked, and she would rather continue these instead of changing verapamil to amlodipine -did not tolerate spironolactone -she will monitor home BP. Goal <130/80. She will contact me if it remains elevated  Palpitations -infrequent, continue to monitor  Cardiac risk counseling and prevention recommendations: -recommend heart healthy/Mediterranean diet, with whole grains, fruits, vegetable, fish, lean meats, nuts, and olive oil. Limit salt. -recommend moderate walking, 3-5 times/week for 30-50 minutes each session. Aim for at least 150 minutes.week. Goal should be pace of 3 miles/hours, or walking 1.5 miles in 30 minutes -recommend avoidance of tobacco products.  Avoid excess alcohol.  Plan for follow up: 1 year  Buford Dresser, MD, PhD [place]  [facility]    Medication Adjustments/Labs and Tests Ordered: Current medicines are reviewed at length with the patient today.  Concerns regarding medicines are outlined above.  Orders Placed This Encounter  Procedures   EKG 12-Lead    No orders of the defined types were placed in this encounter.   Patient Instructions  Medication Instructions:  The current medical regimen is effective;  continue present plan and medications.   *If you need a refill on your cardiac medications before your next appointment, please call your pharmacy*   Lab Work: None    Testing/Procedures: None   Follow-Up: At [facility], you and your health needs are our priority.  As part of our continuing mission to provide you with exceptional heart care, we have created designated Provider Care Teams.  These Care Teams include your primary Cardiologist (physician) and Advanced Practice Providers (APPs -  Physician Assistants and Nurse Practitioners) who all work together to provide you with the care you need, when you need it.  We recommend signing up for the patient portal called "MyChart".  Sign up information is provided on this After Visit Summary.  MyChart is used to connect with patients for Virtual Visits (Telemedicine).  Patients are able to view lab/test results, encounter notes, upcoming appointments, etc.  Non-urgent messages can be sent to your provider as well.   To learn more about what you can do with MyChart, go to [email or web address].    Your next appointment:   1 year(s)  Provider:   Buford Dresser, MD    Other Instructions None    I,Coren O'Brien,acting as a scribe for Buford Dresser, MD.,have documented all relevant documentation on the behalf of Buford Dresser, MD,as directed by  Buford Dresser, MD while in the presence of Buford Dresser, MD.  I, Buford Dresser, MD, have reviewed all documentation for this visit. The documentation on 11/24/22 for the exam, diagnosis, procedures, and orders are all accurate and complete.

## 2022-11-23 NOTE — Patient Instructions (Signed)
Medication Instructions:  The current medical regimen is effective;  continue present plan and medications.   *If you need a refill on your cardiac medications before your next appointment, please call your pharmacy*   Lab Work: None    Testing/Procedures: None   Follow-Up: At [facility], you and your health needs are our priority.  As part of our continuing mission to provide you with exceptional heart care, we have created designated Provider Care Teams.  These Care Teams include your primary Cardiologist (physician) and Advanced Practice Providers (APPs -  Physician Assistants and Nurse Practitioners) who all work together to provide you with the care you need, when you need it.  We recommend signing up for the patient portal called "MyChart".  Sign up information is provided on this After Visit Summary.  MyChart is used to connect with patients for Virtual Visits (Telemedicine).  Patients are able to view lab/test results, encounter notes, upcoming appointments, etc.  Non-urgent messages can be sent to your provider as well.   To learn more about what you can do with MyChart, go to [email or web address].    Your next appointment:   1 year(s)  Provider:   Buford Dresser, MD    Other Instructions None

## 2022-11-24 ENCOUNTER — Encounter (HOSPITAL_BASED_OUTPATIENT_CLINIC_OR_DEPARTMENT_OTHER): Payer: Self-pay | Admitting: Cardiology

## 2023-01-08 DIAGNOSIS — L821 Other seborrheic keratosis: Secondary | ICD-10-CM | POA: Diagnosis not present

## 2023-01-08 DIAGNOSIS — L011 Impetiginization of other dermatoses: Secondary | ICD-10-CM | POA: Diagnosis not present

## 2023-01-08 DIAGNOSIS — D3617 Benign neoplasm of peripheral nerves and autonomic nervous system of trunk, unspecified: Secondary | ICD-10-CM | POA: Diagnosis not present

## 2023-01-08 DIAGNOSIS — L0889 Other specified local infections of the skin and subcutaneous tissue: Secondary | ICD-10-CM | POA: Diagnosis not present

## 2023-01-08 DIAGNOSIS — L300 Nummular dermatitis: Secondary | ICD-10-CM | POA: Diagnosis not present

## 2023-01-08 DIAGNOSIS — L820 Inflamed seborrheic keratosis: Secondary | ICD-10-CM | POA: Diagnosis not present

## 2023-01-08 DIAGNOSIS — D485 Neoplasm of uncertain behavior of skin: Secondary | ICD-10-CM | POA: Diagnosis not present

## 2023-01-11 DIAGNOSIS — I10 Essential (primary) hypertension: Secondary | ICD-10-CM | POA: Diagnosis not present

## 2023-01-11 DIAGNOSIS — G4733 Obstructive sleep apnea (adult) (pediatric): Secondary | ICD-10-CM | POA: Diagnosis not present

## 2023-05-02 DIAGNOSIS — Z1231 Encounter for screening mammogram for malignant neoplasm of breast: Secondary | ICD-10-CM | POA: Diagnosis not present

## 2023-05-22 DIAGNOSIS — H04123 Dry eye syndrome of bilateral lacrimal glands: Secondary | ICD-10-CM | POA: Diagnosis not present

## 2023-05-22 DIAGNOSIS — H26493 Other secondary cataract, bilateral: Secondary | ICD-10-CM | POA: Diagnosis not present

## 2023-05-22 DIAGNOSIS — H401232 Low-tension glaucoma, bilateral, moderate stage: Secondary | ICD-10-CM | POA: Diagnosis not present

## 2023-05-22 DIAGNOSIS — H52203 Unspecified astigmatism, bilateral: Secondary | ICD-10-CM | POA: Diagnosis not present

## 2023-05-22 DIAGNOSIS — H524 Presbyopia: Secondary | ICD-10-CM | POA: Diagnosis not present

## 2023-05-22 DIAGNOSIS — D3132 Benign neoplasm of left choroid: Secondary | ICD-10-CM | POA: Diagnosis not present

## 2023-05-24 DIAGNOSIS — E559 Vitamin D deficiency, unspecified: Secondary | ICD-10-CM | POA: Diagnosis not present

## 2023-05-24 DIAGNOSIS — E890 Postprocedural hypothyroidism: Secondary | ICD-10-CM | POA: Diagnosis not present

## 2023-05-24 DIAGNOSIS — I10 Essential (primary) hypertension: Secondary | ICD-10-CM | POA: Diagnosis not present

## 2023-05-31 DIAGNOSIS — Z0000 Encounter for general adult medical examination without abnormal findings: Secondary | ICD-10-CM | POA: Diagnosis not present

## 2023-05-31 DIAGNOSIS — R21 Rash and other nonspecific skin eruption: Secondary | ICD-10-CM | POA: Diagnosis not present

## 2023-05-31 DIAGNOSIS — E559 Vitamin D deficiency, unspecified: Secondary | ICD-10-CM | POA: Diagnosis not present

## 2023-05-31 DIAGNOSIS — I10 Essential (primary) hypertension: Secondary | ICD-10-CM | POA: Diagnosis not present

## 2023-05-31 DIAGNOSIS — E890 Postprocedural hypothyroidism: Secondary | ICD-10-CM | POA: Diagnosis not present

## 2023-05-31 DIAGNOSIS — K219 Gastro-esophageal reflux disease without esophagitis: Secondary | ICD-10-CM | POA: Diagnosis not present

## 2023-07-13 DIAGNOSIS — I771 Stricture of artery: Secondary | ICD-10-CM | POA: Diagnosis not present

## 2023-07-13 DIAGNOSIS — R053 Chronic cough: Secondary | ICD-10-CM | POA: Diagnosis not present

## 2023-07-24 ENCOUNTER — Encounter (HOSPITAL_COMMUNITY): Payer: Self-pay

## 2023-07-24 ENCOUNTER — Telehealth: Payer: Self-pay | Admitting: Cardiology

## 2023-07-24 ENCOUNTER — Emergency Department (HOSPITAL_COMMUNITY): Payer: Medicare Other

## 2023-07-24 ENCOUNTER — Other Ambulatory Visit: Payer: Self-pay

## 2023-07-24 ENCOUNTER — Emergency Department (HOSPITAL_COMMUNITY)
Admission: EM | Admit: 2023-07-24 | Discharge: 2023-07-24 | Disposition: A | Discharge status: Home / Self Care | Payer: Medicare Other | Attending: Emergency Medicine | Admitting: Emergency Medicine

## 2023-07-24 DIAGNOSIS — I10 Essential (primary) hypertension: Secondary | ICD-10-CM | POA: Diagnosis not present

## 2023-07-24 DIAGNOSIS — Z79899 Other long term (current) drug therapy: Secondary | ICD-10-CM | POA: Diagnosis not present

## 2023-07-24 DIAGNOSIS — R079 Chest pain, unspecified: Secondary | ICD-10-CM | POA: Insufficient documentation

## 2023-07-24 DIAGNOSIS — R110 Nausea: Secondary | ICD-10-CM | POA: Diagnosis not present

## 2023-07-24 DIAGNOSIS — R0789 Other chest pain: Secondary | ICD-10-CM | POA: Diagnosis not present

## 2023-07-24 DIAGNOSIS — R6889 Other general symptoms and signs: Secondary | ICD-10-CM | POA: Diagnosis not present

## 2023-07-24 DIAGNOSIS — Z743 Need for continuous supervision: Secondary | ICD-10-CM | POA: Diagnosis not present

## 2023-07-24 LAB — BASIC METABOLIC PANEL
Anion gap: 11 (ref 5–15)
BUN: 8 mg/dL (ref 8–23)
CO2: 23 mmol/L (ref 22–32)
Calcium: 8.4 mg/dL — ABNORMAL LOW (ref 8.9–10.3)
Chloride: 103 mmol/L (ref 98–111)
Creatinine, Ser: 0.55 mg/dL (ref 0.44–1.00)
GFR, Estimated: >60 mL/min (ref >60)
Glucose, Bld: 95 mg/dL (ref 70–99)
Potassium: 3.1 mmol/L — ABNORMAL LOW (ref 3.5–5.1)
Sodium: 137 mmol/L (ref 135–145)

## 2023-07-24 LAB — CBC
HCT: 37.8 % (ref 36.0–46.0)
Hemoglobin: 12 g/dL (ref 12.0–15.0)
MCH: 29.8 pg (ref 26.0–34.0)
MCHC: 31.7 g/dL (ref 30.0–36.0)
MCV: 93.8 fL (ref 80.0–100.0)
Platelets: 240 10*3/uL (ref 150–400)
RBC: 4.03 MIL/uL (ref 3.87–5.11)
RDW: 14.6 % (ref 11.5–15.5)
WBC: 8.3 10*3/uL (ref 4.0–10.5)
nRBC: 0 % (ref 0.0–0.2)

## 2023-07-24 LAB — D-DIMER, QUANTITATIVE: D-Dimer, Quant: 0.4 ug{FEU}/mL (ref 0.00–0.50)

## 2023-07-24 LAB — TROPONIN I (HIGH SENSITIVITY)
Troponin I (High Sensitivity): 4 ng/L (ref <18)
Troponin I (High Sensitivity): 4 ng/L (ref <18)

## 2023-07-24 MED ORDER — NITROGLYCERIN 0.4 MG SL SUBL
0.4000 mg | SUBLINGUAL_TABLET | SUBLINGUAL | Status: AC
  Administered 2023-07-24: 0.4 mg via SUBLINGUAL
  Filled 2023-07-24: qty 1

## 2023-07-24 MED ORDER — POTASSIUM CHLORIDE CRYS ER 20 MEQ PO TBCR
40.0000 meq | EXTENDED_RELEASE_TABLET | Freq: Once | ORAL | Status: AC
  Administered 2023-07-24: 40 meq via ORAL
  Filled 2023-07-24: qty 2

## 2023-07-24 MED ORDER — FENTANYL CITRATE PF 50 MCG/ML IJ SOSY
50.0000 ug | PREFILLED_SYRINGE | Freq: Once | INTRAMUSCULAR | Status: AC
  Administered 2023-07-24: 50 ug via INTRAVENOUS
  Filled 2023-07-24: qty 1

## 2023-07-24 MED ORDER — LIDOCAINE 5 % EX PTCH
1.0000 | MEDICATED_PATCH | CUTANEOUS | Status: DC
  Administered 2023-07-24: 1 via TRANSDERMAL
  Filled 2023-07-24: qty 1

## 2023-07-24 NOTE — ED Provider Notes (Signed)
[place] EMERGENCY DEPARTMENT AT [facility] Provider Note   CSN: [id number] Arrival date & time: 07/24/23  3818     History  Chief Complaint  Patient presents with   Chest Pain    Miranda Russell is a 77 y.o. female.  77 year old female with a history of DVT not currently on anticoagulation, hypertension, and cardiac murmur who presents to the emergency department with chest pain.  Patient reports that she has been having a sharp chest pain in her left chest.  Says it started at 830 this morning.  Was 10/10 in severity initially.  Says that it radiates to her left shoulder.  Has been nauseous but no vomiting or diaphoresis.  Does not change with exertion.  No shortness of breath.  Given nitroglycerin and 324 of aspirin by EMS with improvement of her pain to 8/10 in severity.  Recently did have a URI but reports that she is not having any fever, cough, or shortness of breath that she got over that illness.  No history of coronary stents.       Home Medications Prior to Admission medications   Medication Sig Start Date End Date Taking? Authorizing Provider  acetaminophen (TYLENOL) 500 MG tablet Take 500-1,000 mg by mouth every 6 (six) hours as needed (for pain.).    [provider]  denosumab (PROLIA) 60 MG/ML SOSY injection Inject 60 mg into the skin every 6 (six) months.    [provider]  irbesartan (AVAPRO) 300 MG tablet Take 300 mg by mouth daily. 01/03/18   [provider]  latanoprost (XALATAN) 0.005 % ophthalmic solution Place 1 drop into both eyes at bedtime. 04/17/19   [provider]  levothyroxine (SYNTHROID) 137 MCG tablet Take 137 mcg by mouth daily before breakfast. 09/19/19   [provider]  Multiple Vitamin (MULTIVITAMIN WITH MINERALS) TABS tablet Take 1 tablet by mouth daily with lunch. Alive for Women 50+    [provider]  omeprazole (PRILOSEC) 20 MG capsule Take 20 mg by mouth daily before supper.   10/19/19   [provider]  timolol (TIMOPTIC) 0.5 % ophthalmic solution Place 1 drop into both eyes in the morning and at bedtime. 10/12/19   [provider]  verapamil (CALAN-SR) 240 MG CR tablet Take 120 mg by mouth daily.  01/20/19   [provider]  Vitamin D, Ergocalciferol, (DRISDOL) 1.25 MG (50000 UT) CAPS capsule Take 50,000 Units by mouth every Monday.  03/04/19   [provider]      Allergies    Morphine and codeine, Codeine, Amoxicillin-pot clavulanate, Keppra [levetiracetam], and Spironolactone    Review of Systems   Review of Systems  Physical Exam Updated Vital Signs BP (!) 164/95   Pulse 72   Temp 98 F (36.7 C) (Oral)   Resp 13   Ht 5\' 5"  (1.651 m)   Wt 78.9 kg   SpO2 100%   BMI 28.95 kg/m  Physical Exam Vitals and nursing note reviewed.  Constitutional:      General: She is not in acute distress.    Appearance: She is well-developed.  HENT:     Head: Normocephalic and atraumatic.     Right Ear: External ear normal.     Left Ear: External ear normal.     Nose: Nose normal.  Eyes:     Extraocular Movements: Extraocular movements intact.     Conjunctiva/sclera: Conjunctivae normal.     Pupils: Pupils are equal, round, and reactive to  light.  Cardiovascular:     Rate and Rhythm: Normal rate and regular rhythm.     Heart sounds: No murmur heard.    Comments: Chest pain not reproducible Pulmonary:     Effort: Pulmonary effort is normal. No respiratory distress.     Breath sounds: Normal breath sounds.  Musculoskeletal:     Cervical back: Normal range of motion and neck supple.     Right lower leg: No edema.     Left lower leg: No edema.  Skin:    General: Skin is warm and dry.  Neurological:     Mental Status: She is alert and oriented to person, place, and time. Mental status is at baseline.  Psychiatric:        Mood and Affect: Mood normal.     ED Results / Procedures / Treatments   Labs (all labs ordered are  listed, but only abnormal results are displayed) Labs Reviewed  BASIC METABOLIC PANEL - Abnormal; Notable for the following components:      Result Value   Potassium 3.1 (*)    Calcium 8.4 (*)    All other components within normal limits  CBC  D-DIMER, QUANTITATIVE  TROPONIN I (HIGH SENSITIVITY)  TROPONIN I (HIGH SENSITIVITY)    EKG EKG Interpretation Date/Time:  Tuesday July 24 2023 10:54:23 EDT Ventricular Rate:  68 PR Interval:  254 QRS Duration:  174 QT Interval:  495 QTC Calculation: 527 R Axis:   89  Text Interpretation: Sinus rhythm Prolonged PR interval Right bundle branch block Confirmed by Vonita Moss [phone]) on 07/24/2023 11:14:26 AM  Radiology DG Chest 2 View  Result Date: 07/24/2023 CLINICAL DATA:  Chest pain. EXAM: CHEST - 2 VIEW COMPARISON:  Chest radiograph dated July 13, 2023. FINDINGS: The heart size and mediastinal contours are within normal limits. No focal consolidation, pneumothorax, or pleural effusion. No acute osseous abnormality. IMPRESSION: No acute cardiopulmonary findings. Electronically Signed   By: Hart Robinsons M.D.   On: 07/24/2023 13:17    Procedures Procedures    Medications Ordered in ED Medications  lidocaine (LIDODERM) 5 % 1 patch (1 patch Transdermal Patch Applied 07/24/23 1401)  nitroGLYCERIN (NITROSTAT) SL tablet 0.4 mg (0.4 mg Sublingual Given 07/24/23 1116)  fentaNYL (SUBLIMAZE) injection 50 mcg (50 mcg Intravenous Given 07/24/23 1250)  potassium chloride SA (KLOR-CON M) CR tablet 40 mEq (40 mEq Oral Given 07/24/23 1401)    ED Course/ Medical Decision Making/ A&P Clinical Course as of 07/24/23 1730  Tue Jul 24, 2023  1018 Went to evaluate patient but she is not yet in room [RP]    Clinical Course User Index [RP] Rondel Baton, MD                                 Medical Decision Making Amount and/or Complexity of Data Reviewed Labs: ordered. Radiology: ordered.  Risk Prescription drug  management.   Miranda Russell is a 77 y.o. female with comorbidities that complicate the patient evaluation including DVT not currently on anticoagulation, hypertension, and cardiac murmur who presents to the emergency department with chest pain.    Initial Ddx:  MI, PE, shingles, costochondritis, pericarditis, pneumonia, dissection  MDM/Course:  Patient presents to the emergency department with left-sided sharp chest pain.  Loaded with aspirin and given nitroglycerin by EMS.  Not exertional or pleuritic.  On exam is not in any acute distress.  Does not have any rashes  or tenderness to palpation of her chest.  Radial pulses 2+ bilaterally.  EKG and serial troponins not consistent with acute MI.  Did send a D-dimer with a history of DVT which was WNL.  Did consider dissection but with symmetric pulses and no white mediastinum and given the nature of her pain with a normal D-dimer I feel this is less likely.  Upon re-evaluation patient reported that her pain had significantly improved after the medication she was given.  Unclear exactly what is causing her symptoms at this time.  Could potentially be due to pleurisy from her pneumonia.  However, not having any significant respiratory symptoms at this time and reports that she has fully recovered from that illness.  Given her risk factors and the fact that he did improve with nitroglycerin we will have her follow-up with cardiology and her primary doctor.  Referral to cardiology placed.  This patient presents to the ED for concern of complaints listed in HPI, this involves an extensive number of treatment options, and is a complaint that carries with it a high risk of complications and morbidity. Disposition including potential need for admission considered.   Dispo: DC Home. Return precautions discussed including, but not limited to, those listed in the AVS. Allowed pt time to ask questions which were answered fully prior to dc.  Additional history  obtained from spouse Records reviewed Outpatient Clinic Notes The following labs were independently interpreted: Serial Troponins and show no acute abnormality I independently reviewed the following imaging with scope of interpretation limited to determining acute life threatening conditions related to emergency care: Chest x-ray and agree with the radiologist interpretation with the following exceptions: none I personally reviewed and interpreted cardiac monitoring: normal sinus rhythm  I personally reviewed and interpreted the pt's EKG: see above for interpretation  I have reviewed the patients home medications and made adjustments as needed Social Determinants of health:  Elderly  Portions of this note were generated with Scientist, clinical (histocompatibility and immunogenetics). Dictation errors may occur despite best attempts at proofreading.           Final Clinical Impression(s) / ED Diagnoses Final diagnoses:  Chest pain, unspecified type    Rx / DC Orders ED Discharge Orders          Ordered    Ambulatory referral to Cardiology        07/24/23 1432              Rondel Baton, MD 07/24/23 1730

## 2023-07-24 NOTE — Telephone Encounter (Signed)
Seen 11/21/22 by Dr. Cristal Deer recommended to f/u in 1 year. Prior calcium score 06/2020 of 0.   ED visit 07/24/23 for chest pain reported as "sharp chest pain in left chest" radiating to Lshoulder. Treated with lidocaine patch, IV fentanyl. Kdur given due to K 3.1. Has been recommended to f/u with PCP in 3 days. Anticipate K level will be rechecked them.   Workup included: EKG NSR 68 bpm with RBBB. No acute St/T wave changes. Stable compared to previous.Troponin x 2 unremarkable. D-dimer negative. CXR no acute findings.  ED workup reassuring. Would recommend OV for further discussion with Dr. Cristal Deer or APP. Dr. Di Kindle availability likely limited.  If needed okay to use my 07/27/23 2:45P slot [the patient scheduled at 2:20 presently listed for 40 min slot is why it is not green on the scheduled] or 08/06/23 at 10:05A with me.   Alver Sorrow, NP

## 2023-07-24 NOTE — Discharge Instructions (Signed)
You were seen for your chest pain in the emergency department.   At home, please take Tylenol and aspirin for your pain.    Follow-up with your primary doctor in 2-3 days regarding your visit.  Cardiology will be calling you regarding an appointment within the next 72 hours.  You may contact them if you do not hear from them in that time using the information in this packet.  Return immediately to the emergency department if you experience any of the following: Worsening pain, difficulty breathing, unexplained vomiting or sweating, or any other concerning symptoms.    Thank you for visiting our Emergency Department. It was a pleasure taking care of you today.

## 2023-07-24 NOTE — Telephone Encounter (Signed)
Pt was seen in the ED and was told to f/u with Dr. Cristal Deer for any recommendations. Please advise.

## 2023-07-24 NOTE — ED Triage Notes (Signed)
Sudden onset of left sided chest pain radiating to left shoulder and back with nausea and generalized weakness since this AM. Initially 10/10 pain scale and now at 7/10. EMS gave 324mg  Aspirin and 2 nitroglycerin SL. EKG shows RBB. Recently dx with URI possibly pneumonia and currently on antibiotics.

## 2023-07-25 NOTE — Telephone Encounter (Signed)
Advised patient and scheduled  Patient stated was feeling better

## 2023-07-27 ENCOUNTER — Encounter (HOSPITAL_BASED_OUTPATIENT_CLINIC_OR_DEPARTMENT_OTHER): Payer: Self-pay | Admitting: Family

## 2023-07-27 ENCOUNTER — Ambulatory Visit (HOSPITAL_BASED_OUTPATIENT_CLINIC_OR_DEPARTMENT_OTHER): Payer: Medicare Other | Admitting: Family

## 2023-07-27 VITALS — BP 126/86 | HR 73 | Ht 66.5 in | Wt 161.8 lb

## 2023-07-27 DIAGNOSIS — R002 Palpitations: Secondary | ICD-10-CM

## 2023-07-27 DIAGNOSIS — I10 Essential (primary) hypertension: Secondary | ICD-10-CM

## 2023-07-27 DIAGNOSIS — R0789 Other chest pain: Secondary | ICD-10-CM

## 2023-07-27 DIAGNOSIS — E876 Hypokalemia: Secondary | ICD-10-CM

## 2023-07-27 NOTE — Progress Notes (Signed)
Cardiology Office Note:  .   Date:  07/27/2023  ID:  Miranda Russell, DOB [birth year], MRN [id number] PCP: Daisy Floro, MD  [facility] Providers Cardiologist:  Jodelle Red, MD    History of Present Illness: .   Miranda Russell is a 77 y.o. female with history of hypertension.  Prior coronary calcium score 06/2020 at 0.  ED visit 07/24/23 for chest pain reported as "sharp chest pain in left chest" radiating to Lshoulder. Treated with lidocaine patch, IV fentanyl. Kdur given due to K 3.1. Workup included: EKG NSR 68 bpm with RBBB. No acute St/T wave changes. Stable compared to previous.Troponin x 2 unremarkable. D-dimer negative. CXR no acute findings.  Presents today for follow-up independently.  When asked to describe her episode reports it was a sharp pain, had to hold onto the counter for a few minutes, then went to sit down in her recliner. Her husband came in and brought her a Diet Coke. She began to feel nauseous then drankt he Diet Coke, burped, pain did not resolve, took the other half of the Diet Coke. Then had persistent chest pain and presented to ED. No diaphoresis, no dyspnea. We reviewed her low potassium int he ED. She did get a small cramp in her leg on the way over here. She does try to drink a lot of water. Rides her exercise bike without dyspnea, pain. Has a plan to start walking but has been sick with sinus infection as well as UTI. Has had no recurrent chest pain. Does note she has been having a lot of burping. She does take Omeprazole on a regular basis every other day.   ROS: Please see the history of present illness.    All other systems reviewed and are negative.   Studies Reviewed: .        Cardiac Studies & Procedures          CT SCANS  CT CARDIAC SCORING (SELF PAY ONLY) 07/01/2020  Addendum 07/01/2020  6:00 PM ADDENDUM REPORT: 07/01/2020 17:58  CLINICAL DATA:  Risk stratification  EXAM: Coronary Calcium Score  TECHNIQUE: The patient was  scanned on a [employer] scanner. Axial non-contrast 3 mm slices were carried out through the heart. The data set was analyzed on a dedicated work station and scored using the Agatson method.  FINDINGS: Non-cardiac: See separate report from [facility].  Ascending Aorta: Mildly dilated size, measuring 40 mm at the mid ascending aorta, measured double oblique at PA bifurcation. No significant calcification.  Pericardium: Normal  Coronary arteries: Arise from normal coronary cusps.  IMPRESSION: Coronary calcium score of 0. This was 0th percentile for age and sex matched control.  Bridgette Christopher   Electronically Signed By: Jodelle Red M.D. On: 07/01/2020 17:58  Narrative EXAM: OVER-READ INTERPRETATION  CT CHEST  The following report is an over-read performed by radiologist Dr. Charlett Nose of [facility] on 07/01/2020. This over-read does not include interpretation of cardiac or coronary anatomy or pathology. The coronary calcium score interpretation by the cardiologist is attached.  COMPARISON:  None.  FINDINGS: Vascular: Heart is normal size.  Aorta normal caliber.  Mediastinum/Nodes: No adenopathy.  Lungs/Pleura: Linear scarring in the lower lungs.  No effusions.  Upper Abdomen: 3 cm cyst in the right hepatic dome. No acute findings.  Musculoskeletal: Chest wall soft tissues are unremarkable. No acute bony abnormality.  IMPRESSION: No acute extra cardiac abnormality.  Electronically Signed: By: Charlett Nose M.D. On: 07/01/2020 14:27  Risk Assessment/Calculations:             Physical Exam:   VS:  BP 126/86   Pulse 73   Ht 5' 6.5" (1.689 m)   Wt 161 lb 12.8 oz (73.4 kg)   SpO2 95%   BMI 25.72 kg/m    Wt Readings from Last 3 Encounters:  07/27/23 161 lb 12.8 oz (73.4 kg)  07/24/23 173 lb 15.1 oz (78.9 kg)  11/23/22 174 lb (78.9 kg)    GEN: Well nourished, well developed in no acute  distress NECK: No JVD; No carotid bruits CARDIAC: RRR, no murmurs, rubs, gallops RESPIRATORY:  Clear to auscultation without rales, wheezing or rhonchi  ABDOMEN: Soft, non-tender, non-distended EXTREMITIES:  No edema; No deformity   ASSESSMENT AND PLAN: .    Chest pain in adult -recent ED visit with atypical chest pain.  ED workup reassuring with normal troponin, EKG.  She has not had recurrent symptoms.  Prior coronary calcium score 2021 with calcium score of 0.  Reassurance provided regarding atypical symptoms.  We discussed possible repeat coronary calcium scoring but as she has had no recurrent symptoms she agrees to defer.   HTN - BP well controlled. Continue current antihypertensive regimen irbesartan 300 mg daily, verapamil 120 mg daily.  Hypokalemia - Repleted in recent ED visit for K 3.1. update BMP today.   Palpitations -quiescent.  Continue present dose verapamil.  Family history of cardiovascular disease - Atypical chest pain, as above. Recommend aiming for 150 minutes of moderate intensity activity per week and following a heart healthy diet.         Dispo: follow up 11/2023 with Dr. Cristal Deer  Signed, Alver Sorrow, NP

## 2023-07-27 NOTE — Patient Instructions (Addendum)
Medication Instructions:  Your physician recommends that you continue on your current medications as directed. Please refer to the Current Medication list given to you today.  *If you need a refill on your cardiac medications before your next appointment, please call your pharmacy*   Lab Work: Your physician recommends that you return for lab work today-BMP   Follow-Up: At [facility], you and your health needs are our priority.  As part of our continuing mission to provide you with exceptional heart care, we have created designated Provider Care Teams.  These Care Teams include your primary Cardiologist (physician) and Advanced Practice Providers (APPs -  Physician Assistants and Nurse Practitioners) who all work together to provide you with the care you need, when you need it.  We recommend signing up for the patient portal called "MyChart".  Sign up information is provided on this After Visit Summary.  MyChart is used to connect with patients for Virtual Visits (Telemedicine).  Patients are able to view lab/test results, encounter notes, upcoming appointments, etc.  Non-urgent messages can be sent to your provider as well.   To learn more about what you can do with MyChart, go to [email or web address].    Your next appointment:   Follow up with Dr. Cristal Deer in February    Other Instructions Your chest pain was atypical for heart related pain. Heart related pain typically happens when you are up doing something and your heart is working harder. Your chest pain was more likely indigestion or a muscle spasm.   Keep up the great work with your heart healthy diet and regular exercise routine! These are great for keeping your heart healthy.

## 2023-07-28 LAB — BASIC METABOLIC PANEL
BUN/Creatinine Ratio: 12 (ref 12–28)
BUN: 9 mg/dL (ref 8–27)
CO2: 26 mmol/L (ref 20–29)
Calcium: 9.2 mg/dL (ref 8.7–10.3)
Chloride: 103 mmol/L (ref 96–106)
Creatinine, Ser: 0.77 mg/dL (ref 0.57–1.00)
Glucose: 64 mg/dL — ABNORMAL LOW (ref 70–99)
Potassium: 4.1 mmol/L (ref 3.5–5.2)
Sodium: 143 mmol/L (ref 134–144)
eGFR: 80 mL/min/{1.73_m2} (ref >59)

## 2023-08-01 DIAGNOSIS — Z23 Encounter for immunization: Secondary | ICD-10-CM | POA: Diagnosis not present

## 2023-08-01 DIAGNOSIS — R079 Chest pain, unspecified: Secondary | ICD-10-CM | POA: Diagnosis not present

## 2023-08-01 DIAGNOSIS — G4733 Obstructive sleep apnea (adult) (pediatric): Secondary | ICD-10-CM | POA: Diagnosis not present

## 2023-08-15 DIAGNOSIS — G4733 Obstructive sleep apnea (adult) (pediatric): Secondary | ICD-10-CM | POA: Diagnosis not present

## 2023-10-17 DIAGNOSIS — G4733 Obstructive sleep apnea (adult) (pediatric): Secondary | ICD-10-CM | POA: Diagnosis not present

## 2023-10-29 NOTE — Progress Notes (Unsigned)
   Rubin Payor, PhD, LAT, ATC acting as a scribe for Clementeen Graham, MD.  Miranda Russell is a 78 y.o. female who presents to [facility] at [facility] today for osteoporosis management. Family hx of cancer; aunt (breast), cousin (colon).   DEXA scan (date, T-score): 01/24/22: Spine= -0.5, L-FN= -2.3, R-FN= -2.2 Prior treatment: Prolia- last injection ~18 months ago History of Hip, Spine, or Wrist Fx: no Heart disease or stroke: no Cancer: no Kidney Disease: no Gastric/Peptic Ulcer: yes Gastric bypass surgery: no Severe GERD: yes Hx of seizures: no Age at Menopause: unable to determine; hystectomy at 31 Calcium intake: yes Vitamin D intake: yes- 50,000iU Hormone replacement therapy: no Smoking history: never smoked Alcohol: occasional Exercise: intermittent Major dental work in past year: no Parents with hip/spine fracture: no Height loss: yes: 68" to 65.5"   Pertinent review of systems: no fever or chills  Relevant historical information: Previous DEXA scan showed osteoporosis with T score<-2.5.  Previously treated with Prolia but has not been treated for the last 18 months.   Exam:  BP (!) 148/86   Pulse 78   Ht 5' 5.5" (1.664 m)   Wt 163 lb (73.9 kg)   SpO2 96%   BMI 26.71 kg/m  General: Well Developed, well nourished, and in no acute distress.   MSK: Lumbar motion intact and normal.  Normal gait.      Assessment and Plan: 78 y.o. female with osteoporosis based on prior DEXA scan with a T-score worse than -2.5.  Her most recent DEXA scan which is approaching 78 years old was just above the limit for osteopenia.  She is due for another DEXA scan in April.  Given that she has previously been treated with Prolia we will go ahead and try to get it approved again.  Will get labs in preparation for Prolia.  Of note she mentions she may have had a rash with Prolia but is not sure as the rash continued when the Prolia stopped.   PDMP not reviewed this  encounter. Orders Placed This Encounter  Procedures   Comprehensive metabolic panel    Osteoporis    Standing Status:   Future    Number of Occurrences:   1    Expiration Date:   10/29/2024   Magnesium    Therapeutic drug monitoring    Standing Status:   Future    Number of Occurrences:   1    Expiration Date:   10/29/2024   Phosphorus    Osteroporis    Standing Status:   Future    Number of Occurrences:   1    Expiration Date:   10/29/2024   VITAMIN D 25 Hydroxy (Vit-D Deficiency, Fractures)    Osteoporis    Standing Status:   Future    Number of Occurrences:   1    Expiration Date:   10/29/2024   No orders of the defined types were placed in this encounter.    Discussed warning signs or symptoms. Please see discharge instructions. Patient expresses understanding.   The above documentation has been reviewed and is accurate and complete Clementeen Graham, M.D.

## 2023-10-30 ENCOUNTER — Ambulatory Visit: Payer: Medicare Other | Admitting: Family Medicine

## 2023-10-30 ENCOUNTER — Encounter: Payer: Self-pay | Admitting: Family Medicine

## 2023-10-30 ENCOUNTER — Telehealth: Payer: Self-pay | Admitting: Family Medicine

## 2023-10-30 VITALS — BP 148/86 | HR 78 | Ht 65.5 in | Wt 163.0 lb

## 2023-10-30 DIAGNOSIS — M810 Age-related osteoporosis without current pathological fracture: Secondary | ICD-10-CM | POA: Diagnosis not present

## 2023-10-30 LAB — COMPREHENSIVE METABOLIC PANEL
ALT: 10 U/L (ref 0–35)
AST: 17 U/L (ref 0–37)
Albumin: 4.2 g/dL (ref 3.5–5.2)
Alkaline Phosphatase: 110 U/L (ref 39–117)
BUN: 13 mg/dL (ref 6–23)
CO2: 31 meq/L (ref 19–32)
Calcium: 9.2 mg/dL (ref 8.4–10.5)
Chloride: 101 meq/L (ref 96–112)
Creatinine, Ser: 0.71 mg/dL (ref 0.40–1.20)
GFR: 82.36 mL/min (ref >60.00)
Glucose, Bld: 82 mg/dL (ref 70–99)
Potassium: 3.6 meq/L (ref 3.5–5.1)
Sodium: 140 meq/L (ref 135–145)
Total Bilirubin: 0.6 mg/dL (ref 0.2–1.2)
Total Protein: 7 g/dL (ref 6.0–8.3)

## 2023-10-30 LAB — MAGNESIUM: Magnesium: 1.9 mg/dL (ref 1.5–2.5)

## 2023-10-30 LAB — VITAMIN D 25 HYDROXY (VIT D DEFICIENCY, FRACTURES): VITD: 113.2 ng/mL (ref 30.00–100.00)

## 2023-10-30 LAB — PHOSPHORUS: Phosphorus: 4.1 mg/dL (ref 2.3–4.6)

## 2023-10-30 NOTE — Patient Instructions (Signed)
Thank you for coming in today.   Please get labs today before you leave   You should hear soon about Prolia.   Keep working home exercises.   I do recommend a repeat bone density in April of this year.

## 2023-10-30 NOTE — Telephone Encounter (Signed)
[facility] lab called to report a critically high lab level.  Vit D: 113.20

## 2023-10-30 NOTE — Progress Notes (Signed)
I called Miranda Russell and reviewed her vitamin D level.  She is taking 50,000 units weekly and 2000 units daily.  Recommended that she stop vitamin D now and in 2 months start the daily 2000 units.  We should recheck vitamin D in about 6 months.

## 2023-10-30 NOTE — Telephone Encounter (Signed)
Dr. Denyse Amass called pt directly

## 2023-11-01 NOTE — Telephone Encounter (Signed)
Called patient and reviewed lab results and appropriate recommendations.

## 2023-11-22 NOTE — Progress Notes (Signed)
  Cardiology Office Note:  .   Date:  12/02/2023  ID:  Miranda Russell, DOB Feb 11, 1946, MRN [id number] PCP: Daisy Floro, MD  [facility] Providers Cardiologist:  Jodelle Red, MD {  History of Present Illness: .   Miranda Russell is a 78 y.o. female with a hx of hypertension who is seen for follow up today. I initially met her 06/24/20 as a new consult at the request of Daisy Floro, MD for the evaluation and management of progressive weakness, family history of CV disease and hypertension.   CV history: Ca score 2021 was 0.  Today: BP 123/80 yesterday at home. Tolerating medications well. Hasn't felt any low blood pressure symptoms.   Has recent sleep study. Was recommended for CPAP but she would like to try the mouthgard first.   Palpitations are very rare and not bothersome currently. Has occasional mild vertigo if she turns too quickly.   Stays active. Rode her bike for 6.5 miles yesterday.   ROS: Denies chest pain, shortness of breath at rest or with normal exertion. No PND, orthopnea, LE edema or unexpected weight gain. No syncope. ROS otherwise negative except as noted.   Studies Reviewed: Marland Kitchen    EKG:       Physical Exam:   VS:  BP 130/78   Pulse 64   Ht 5' 5.5" (1.664 m)   Wt 163 lb (73.9 kg)   SpO2 97%   BMI 26.71 kg/m    Wt Readings from Last 3 Encounters:  11/23/23 163 lb (73.9 kg)  10/30/23 163 lb (73.9 kg)  07/27/23 161 lb 12.8 oz (73.4 kg)    GEN: Well nourished, well developed in no acute distress HEENT: Normal, moist mucous membranes NECK: No JVD CARDIAC: regular rhythm, normal S1 and S2, no rubs or gallops. No murmur. VASCULAR: Radial and DP pulses 2+ bilaterally. No carotid bruits RESPIRATORY:  Clear to auscultation without rales, wheezing or rhonchi  ABDOMEN: Soft, non-tender, non-distended MUSCULOSKELETAL:  Ambulates independently SKIN: Warm and dry, no edema NEUROLOGIC:  Alert and oriented x 3. No focal neuro deficits  noted. PSYCHIATRIC:  Normal affect    ASSESSMENT AND PLAN: .    Family history of CV disease Dilation of ascending aorta -calcium score 0. Asc Ao is 40 mm, has been stable. If no further plans to follow with CT, consider echo -no indication for statin based on calcium score of 0 -discussed red flag warning signs that need immediate medical attention   Hypertension -continue irbesartan and verapamil. It took time to find a regimen that worked, and she would rather continue these instead of changing verapamil to amlodipine -did not tolerate spironolactone -she will monitor home BP. Goal <130/80. She will contact me if elevated   Palpitations -infrequent, continue to monitor  CV risk counseling and prevention -recommend heart healthy/Mediterranean diet, with whole grains, fruits, vegetable, fish, lean meats, nuts, and olive oil. Limit salt. -recommend moderate walking, 3-5 times/week for 30-50 minutes each session. Aim for at least 150 minutes.week. Goal should be pace of 3 miles/hours, or walking 1.5 miles in 30 minutes -recommend avoidance of tobacco products. Avoid excess alcohol.  Dispo: 1 year or sooner as needed  Signed, Jodelle Red, MD   Jodelle Red, MD, PhD, [facility]  [facility]  [place]  [facility] at [facility] at [facility] [address] [place], [place] [address] [phone]

## 2023-11-23 ENCOUNTER — Ambulatory Visit (HOSPITAL_BASED_OUTPATIENT_CLINIC_OR_DEPARTMENT_OTHER): Payer: Medicare Other | Admitting: Cardiology

## 2023-11-23 ENCOUNTER — Encounter (HOSPITAL_BASED_OUTPATIENT_CLINIC_OR_DEPARTMENT_OTHER): Payer: Self-pay | Admitting: Cardiology

## 2023-11-23 VITALS — BP 130/78 | HR 64 | Ht 65.5 in | Wt 163.0 lb

## 2023-11-23 DIAGNOSIS — Z8249 Family history of ischemic heart disease and other diseases of the circulatory system: Secondary | ICD-10-CM | POA: Diagnosis not present

## 2023-11-23 DIAGNOSIS — Z7189 Other specified counseling: Secondary | ICD-10-CM

## 2023-11-23 DIAGNOSIS — I10 Essential (primary) hypertension: Secondary | ICD-10-CM | POA: Diagnosis not present

## 2023-11-23 DIAGNOSIS — I77810 Thoracic aortic ectasia: Secondary | ICD-10-CM | POA: Diagnosis not present

## 2023-11-23 NOTE — Patient Instructions (Signed)
 Medication Instructions:  Your physician recommends that you continue on your current medications as directed. Please refer to the Current Medication list given to you today.  Follow-Up: At [facility], you and your health needs are our priority.  As part of our continuing mission to provide you with exceptional heart care, we have created designated Provider Care Teams.  These Care Teams include your primary Cardiologist (physician) and Advanced Practice Providers (APPs -  Physician Assistants and Nurse Practitioners) who all work together to provide you with the care you need, when you need it.  We recommend signing up for the patient portal called "MyChart".  Sign up information is provided on this After Visit Summary.  MyChart is used to connect with patients for Virtual Visits (Telemedicine).  Patients are able to view lab/test results, encounter notes, upcoming appointments, etc.  Non-urgent messages can be sent to your provider as well.   To learn more about what you can do with MyChart, go to [email or web address].    Your next appointment:   1 year  Provider:   Jodelle Red, MD

## 2023-11-29 DIAGNOSIS — H401222 Low-tension glaucoma, left eye, moderate stage: Secondary | ICD-10-CM | POA: Diagnosis not present

## 2023-11-29 DIAGNOSIS — H04123 Dry eye syndrome of bilateral lacrimal glands: Secondary | ICD-10-CM | POA: Diagnosis not present

## 2023-12-03 ENCOUNTER — Encounter (HOSPITAL_BASED_OUTPATIENT_CLINIC_OR_DEPARTMENT_OTHER): Payer: Self-pay

## 2023-12-03 ENCOUNTER — Telehealth: Payer: Self-pay | Admitting: Family Medicine

## 2023-12-03 DIAGNOSIS — I77810 Thoracic aortic ectasia: Secondary | ICD-10-CM

## 2023-12-03 DIAGNOSIS — M810 Age-related osteoporosis without current pathological fracture: Secondary | ICD-10-CM | POA: Insufficient documentation

## 2023-12-03 NOTE — Addendum Note (Signed)
 Addended by: Dierdre Searles on: 12/03/2023 01:22 PM   Modules accepted: Orders

## 2023-12-03 NOTE — Telephone Encounter (Signed)
 Order placed for Prolia with [facility]

## 2023-12-03 NOTE — Telephone Encounter (Signed)
 Pt seen 10/30/2023, said we were going to check on Prolia for her. Requesting status of this request.

## 2023-12-06 ENCOUNTER — Telehealth: Payer: Self-pay

## 2023-12-06 NOTE — Telephone Encounter (Signed)
 Wynelle Link, CPhT7 hours ago (8:39 AM)    Hello,   Auth Submission: DENIED Site of care: Site of care: [facility] Payer: [employer] medic[employer] Medication & CPT/J Code(s) submitted: prolia [id number] Route of submission (phone, fax, portal): portal Phone # Fax # Auth type: Buy/Bill PB Units/visits requested: 60mg ,q32mont\hs Reference number: [id number]     Authorization has been DENIED because this is not a preferred medication. The preferred meds are listed on the denial letter that is scanned in the media tab.   Thanks

## 2023-12-06 NOTE — Telephone Encounter (Signed)
 Hello,  Auth Submission: DENIED Site of care: Site of care: [facility] Payer: [employer] medic[employer] Medication & CPT/J Code(s) submitted: prolia [id number] Route of submission (phone, fax, portal): portal Phone # Fax # Auth type: Buy/Bill PB Units/visits requested: 60mg ,q46mont\hs Reference number: [id number]   Authorization has been DENIED because this is not a preferred medication. The preferred meds are listed on the denial letter that is scanned in the media tab.  Thanks

## 2023-12-07 NOTE — Telephone Encounter (Signed)
 [employer] appeals and grievances form printed.

## 2023-12-10 NOTE — Telephone Encounter (Addendum)
 [facility] Medicare policy requires that patient have trial/failure or intolerance to BOTH oral and IV bisphosphonate.   Pt has contraindication to oral bisphosphonate d/t hx of PUD and GERD.   Do not see contraindication for Reclast (hypocalcemia, CKD).  Pt will need to try Reclast before insurance will consider coverage for Prolia.   Forwarding to Dr. Denyse Amass to review and advise.     Pharmacy - may be covered under pharmacy benefit. Per [email or web address], no prior auth required. Please let me know if you would like for me to try sending Rx to [facility].

## 2023-12-12 MED ORDER — DENOSUMAB 60 MG/ML ~~LOC~~ SOSY: 60.0000 mg | PREFILLED_SYRINGE | SUBCUTANEOUS | 1 refills | Status: DC

## 2023-12-12 NOTE — Addendum Note (Signed)
 Addended by: Dierdre Searles on: 12/12/2023 10:01 AM   Modules accepted: Orders

## 2023-12-12 NOTE — Telephone Encounter (Signed)
 Spoke with Dr. Denyse Amass, he is agreeable to obtaining Prolia through pharmacy benefits before setting Sundeep up for Reclast.   Called and spoke with Bonita Quin, she is agreeable to obtained Prolia though pharmacy benefits. I let her know that estimated 20% co-insurance amount and that [facility] would be reaching out to confirm out of pocket cost and shipping. I requested that Dashanique give me a call in 1 week if she has not heard from the pharmacy or is she has any questions in the meantime. Christel verbalized understanding.    Rx for Prolia sent to [facility].

## 2023-12-13 ENCOUNTER — Telehealth: Payer: Self-pay | Admitting: Family Medicine

## 2023-12-13 NOTE — Telephone Encounter (Signed)
 Called pt at [phone] and left VM to call the office.

## 2023-12-13 NOTE — Telephone Encounter (Signed)
 Miranda Russell E48 minutes ago (10:40 AM)   AK Patient called about speaking to [place] speaking to her about insurance turning down prolia yesterday but it went through pharmacy.  She said she thought when speaking to [place] that the copay was going to be $335 but the website to order sent an email that the copay would be $555. She want to double check with Gearldine Bienenstock about that.

## 2023-12-13 NOTE — Telephone Encounter (Signed)
 Patient called about speaking to [place] speaking to her about insurance turning down prolia yesterday but it went through pharmacy.  She said she thought when speaking to [place] that the copay was going to be $335 but the website to order sent an email that the copay would be $555. She want to double check with Gearldine Bienenstock about that.

## 2023-12-14 NOTE — Telephone Encounter (Signed)
 Called and spoke with patient. She would like to see what the OOP cost would be under the medical benefits if Prolia is given though the [facility]. I will place order and pt will touch base if this option is also too expensive.

## 2023-12-14 NOTE — Telephone Encounter (Signed)
 Auth Submission: DENIED Site of care: [facility] of care: [facility] Payer: [employer] medic[employer] Medication & CPT/J Code(s) submitted: prolia [id number] Route of submission (phone, fax, portal): portal Phone # Fax # Auth type: Buy/Bill PB Units/visits requested: 60mg ,q67mont\hs Reference number: [id number]     Authorization has been DENIED because this is not a preferred medication. The preferred meds are listed on the denial letter that is scanned in the media tab.   Thanks

## 2023-12-19 NOTE — Telephone Encounter (Signed)
 Called pt at [phone] and left VM to call the office.

## 2023-12-20 NOTE — Telephone Encounter (Signed)
Forwarding to Dr. Corey to advise.  

## 2023-12-20 NOTE — Telephone Encounter (Signed)
 Message sent to pt via MyChart earlier today.

## 2023-12-24 ENCOUNTER — Telehealth: Payer: Self-pay | Admitting: Pharmacy Technician

## 2023-12-24 ENCOUNTER — Telehealth: Payer: Self-pay

## 2023-12-24 NOTE — Telephone Encounter (Signed)
 [name]  You27 minutes ago (11:07 AM)    Do you know how much it will cost (my part)? And yes, I am interested.     You  Lilli Light Jones2 hours ago (9:13 AM)    Peri [place] Morning Bonita Quin,  Dr. Denyse Amass recommends Reclast as an alternative treatment option for your osteoporosis. Reclast is administered as a once-yearly infusion at the [facility].  If you are interested in pursuing this treatment, please let us know. Once we place the order, the infusion center will check your insurance coverage for Reclast and contact you to review the details and assist with scheduling.  We look forward to hearing your thoughts on starting this once-yearly Reclast infusion,    Lucilla Petrenko, CMA

## 2023-12-24 NOTE — Telephone Encounter (Signed)
 Order placed for Reclast with [facility].

## 2023-12-24 NOTE — Telephone Encounter (Signed)
 Auth Submission: NO AUTH NEEDED Site of care: Site of care: [facility] Payer: [employer] Medication & CPT/J Code(s) submitted: Reclast (Zolendronic acid) [id number] Route of submission (phone, fax, portal):  Phone # Fax # Auth type: Buy/Bill PB Units/visits requested: 1 DOSE Reference number:  Approval from: 12/24/23 to 10/01/24

## 2023-12-26 NOTE — Telephone Encounter (Signed)
 Patient called stating that the infusion center was not able to give her a cost for the Reclast. She was told that we would give that information?  Please advise.

## 2023-12-27 ENCOUNTER — Ambulatory Visit (INDEPENDENT_AMBULATORY_CARE_PROVIDER_SITE_OTHER)

## 2023-12-27 DIAGNOSIS — I082 Rheumatic disorders of both aortic and tricuspid valves: Secondary | ICD-10-CM | POA: Diagnosis not present

## 2023-12-27 DIAGNOSIS — I77810 Thoracic aortic ectasia: Secondary | ICD-10-CM

## 2023-12-27 LAB — ECHOCARDIOGRAM COMPLETE
AV Vena cont: 0.57 cm
Area-P 1/2: 4.89 cm2
P 1/2 time: 607 ms
S' Lateral: 2.55 cm

## 2023-12-28 NOTE — Telephone Encounter (Signed)
 The [phone] will be under part b drug it will be 20% coinsurance. No DED on the plan and the coinsurance will apply towards the OOP of $ 3900.00 and $ 60.00 Satisfied.

## 2023-12-31 ENCOUNTER — Encounter (HOSPITAL_BASED_OUTPATIENT_CLINIC_OR_DEPARTMENT_OTHER): Payer: Self-pay

## 2024-01-10 ENCOUNTER — Telehealth: Payer: Self-pay | Admitting: Family Medicine

## 2024-01-10 NOTE — Telephone Encounter (Signed)
 Prolia to be delivered on 4/15.

## 2024-01-11 ENCOUNTER — Telehealth: Payer: Self-pay | Admitting: Family Medicine

## 2024-01-11 ENCOUNTER — Ambulatory Visit

## 2024-01-11 NOTE — Telephone Encounter (Signed)
 Pt called, has decided NOT to do the Reclast. [facility] will send the Prolia to [place], please contact pt when we receive for scheduling.

## 2024-01-15 NOTE — Telephone Encounter (Signed)
 Patient scheduled for tomorrow (4/15) at 11:15am.

## 2024-01-16 ENCOUNTER — Ambulatory Visit

## 2024-01-16 DIAGNOSIS — M810 Age-related osteoporosis without current pathological fracture: Secondary | ICD-10-CM | POA: Diagnosis not present

## 2024-01-16 DIAGNOSIS — J029 Acute pharyngitis, unspecified: Secondary | ICD-10-CM | POA: Diagnosis not present

## 2024-01-16 MED ORDER — DENOSUMAB 60 MG/ML ~~LOC~~ SOSY
60.0000 mg | PREFILLED_SYRINGE | Freq: Once | SUBCUTANEOUS | Status: AC
  Administered 2024-01-16: 60 mg via SUBCUTANEOUS

## 2024-01-16 NOTE — Progress Notes (Signed)
 Patient visits today for their prolia injection. Patient informed of what they have received and tolerated injection well. Patient notified to reach out to office if needed.

## 2024-01-21 NOTE — Telephone Encounter (Signed)
 Pt decided to proceed with Prolia .

## 2024-01-21 NOTE — Telephone Encounter (Signed)
 Miranda Russell   01/11/24 11:04 AM Note Pt called, has decided NOT to do the Reclast. [facility] will send the Prolia  to us , please contact pt when we receive for scheduling.

## 2024-01-21 NOTE — Telephone Encounter (Signed)
 Last Prolia  inj 01/16/24 Next Prolia  inj due 07/18/24

## 2024-01-23 NOTE — Telephone Encounter (Signed)
 Noted.  Please me know if you need me to do anything more

## 2024-02-11 DIAGNOSIS — M816 Localized osteoporosis [Lequesne]: Secondary | ICD-10-CM | POA: Diagnosis not present

## 2024-02-11 LAB — HM DEXA SCAN

## 2024-02-28 DIAGNOSIS — G4733 Obstructive sleep apnea (adult) (pediatric): Secondary | ICD-10-CM | POA: Diagnosis not present

## 2024-03-12 DIAGNOSIS — N3000 Acute cystitis without hematuria: Secondary | ICD-10-CM | POA: Diagnosis not present

## 2024-03-12 DIAGNOSIS — R300 Dysuria: Secondary | ICD-10-CM | POA: Diagnosis not present

## 2024-04-02 DIAGNOSIS — N390 Urinary tract infection, site not specified: Secondary | ICD-10-CM | POA: Diagnosis not present

## 2024-06-04 ENCOUNTER — Telehealth: Payer: Self-pay | Admitting: Family Medicine

## 2024-06-04 NOTE — Telephone Encounter (Signed)
 Called and spoke with patient, Miranda Russell [facility] Medicare has changes preferred medication from Prolia  to Jubbonti.   Will need to investigate this and call pt back.

## 2024-06-04 NOTE — Telephone Encounter (Signed)
 Patient received a letter that insurance is no longer covering Prolia  and she would like to speak with someone about that. Please advise.

## 2024-06-05 DIAGNOSIS — D3132 Benign neoplasm of left choroid: Secondary | ICD-10-CM | POA: Diagnosis not present

## 2024-06-05 DIAGNOSIS — H04123 Dry eye syndrome of bilateral lacrimal glands: Secondary | ICD-10-CM | POA: Diagnosis not present

## 2024-06-05 DIAGNOSIS — H52203 Unspecified astigmatism, bilateral: Secondary | ICD-10-CM | POA: Diagnosis not present

## 2024-06-05 DIAGNOSIS — H524 Presbyopia: Secondary | ICD-10-CM | POA: Diagnosis not present

## 2024-06-05 DIAGNOSIS — H26493 Other secondary cataract, bilateral: Secondary | ICD-10-CM | POA: Diagnosis not present

## 2024-06-05 DIAGNOSIS — H401222 Low-tension glaucoma, left eye, moderate stage: Secondary | ICD-10-CM | POA: Diagnosis not present

## 2024-06-05 MED ORDER — DENOSUMAB 60 MG/ML ~~LOC~~ SOSY: 60.0000 mg | PREFILLED_SYRINGE | SUBCUTANEOUS | 0 refills | Status: DC

## 2024-06-05 NOTE — Addendum Note (Signed)
 Addended by: MARDY LEOTIS RAMAN on: 06/05/2024 01:55 PM   Modules accepted: Orders

## 2024-06-05 NOTE — Telephone Encounter (Signed)
 Fax received from [facility] for Prolia .   Rx sent.

## 2024-06-06 NOTE — Telephone Encounter (Signed)
 CoverMyMeds.Miranda Russell (Key: [id number]) Rx #: [id number]

## 2024-06-09 NOTE — Telephone Encounter (Signed)
 Prior Authorization initiated for PROLIA  via CoverMyMeds.[email or web address] KEY: [id number]

## 2024-06-10 NOTE — Telephone Encounter (Signed)
 Prior Authorization for PROLIA  DENIED    PROLIA  INJ 60MG /ML is denied because it is not on your plan's Drug List (formulary). Medication authorization requires the following: Paid claim or submission of medical records documenting trial and failure, contraindication, or intolerance to the following: Jubbonti .

## 2024-06-11 DIAGNOSIS — Z23 Encounter for immunization: Secondary | ICD-10-CM | POA: Diagnosis not present

## 2024-06-11 DIAGNOSIS — Z0000 Encounter for general adult medical examination without abnormal findings: Secondary | ICD-10-CM | POA: Diagnosis not present

## 2024-06-11 DIAGNOSIS — E559 Vitamin D deficiency, unspecified: Secondary | ICD-10-CM | POA: Diagnosis not present

## 2024-06-11 DIAGNOSIS — K219 Gastro-esophageal reflux disease without esophagitis: Secondary | ICD-10-CM | POA: Diagnosis not present

## 2024-06-11 DIAGNOSIS — E7800 Pure hypercholesterolemia, unspecified: Secondary | ICD-10-CM | POA: Diagnosis not present

## 2024-06-11 DIAGNOSIS — I10 Essential (primary) hypertension: Secondary | ICD-10-CM | POA: Diagnosis not present

## 2024-06-11 DIAGNOSIS — E890 Postprocedural hypothyroidism: Secondary | ICD-10-CM | POA: Diagnosis not present

## 2024-06-11 DIAGNOSIS — M461 Sacroiliitis, not elsewhere classified: Secondary | ICD-10-CM | POA: Diagnosis not present

## 2024-06-11 MED ORDER — JUBBONTI 60 MG/ML ~~LOC~~ SOSY: 1.0000 mL | PREFILLED_SYRINGE | SUBCUTANEOUS | 0 refills | Status: AC

## 2024-06-11 NOTE — Telephone Encounter (Signed)
 Prior Authorization initiated for JUBBONTI  via [email or web address] KEY: [id number]  Outcome Additional Information Required This medication or product is on your plan's list of covered drugs. Prior authorization is not required at this time. If your pharmacy has questions regarding the processing of your prescription, please have them call the [facility] pharmacy help desk at [phone]. **Please note: This request was submitted electronically. Formulary lowering, tiering exception, cost reduction and/or pre-benefit determination review (including prospective Medicare hospice reviews) requests cannot be requested using this method of submission. Providers contact us  at [phone] for further assistance.

## 2024-06-11 NOTE — Addendum Note (Signed)
 Addended by: MARDY LEOTIS RAMAN on: 06/11/2024 02:58 PM   Modules accepted: Orders

## 2024-06-11 NOTE — Telephone Encounter (Signed)
 Called and spoke with patient, she is agreeable to proceeding with Jubbonti .   Rx sent to [facility]. Pt will reach out if cost is too high.

## 2024-06-16 NOTE — Telephone Encounter (Signed)
 Prolia  VOB initiated via [email or web address]  Next Prolia  inj DUE: 07/18/24

## 2024-06-17 NOTE — Telephone Encounter (Signed)
 Pharmacy Benefit  Prior Authorization REQUIRED for PROLIA 

## 2024-06-17 NOTE — Telephone Encounter (Signed)
 Me    06/11/24  2:33 PM Note Called and spoke with patient, she is agreeable to proceeding with Jubbonti .    Rx sent to [facility]. Pt will reach out if cost is too high.      Me    06/11/24  2:30 PM Note Prior Authorization initiated for JUBBONTI  via [email or web address] KEY: [id number]   Outcome Additional Information Required This medication or product is on your plan's list of covered drugs. Prior authorization is not required at this time. If your pharmacy has questions regarding the processing of your prescription, please have them call the [facility] pharmacy help desk at [phone]. **Please note: This request was submitted electronically. Formulary lowering, tiering exception, cost reduction and/or pre-benefit determination review (including prospective Medicare hospice reviews) requests cannot be requested using this method of submission. Providers contact us  at [phone] for further assistance.               Me    06/10/24  3:52 PM Note Prior Authorization for PROLIA  DENIED      PROLIA  INJ 60MG /ML is denied because it is not on your plan's Drug List (formulary). Medication authorization requires the following: Paid claim or submission of medical records documenting trial and failure, contraindication, or intolerance to the following: Jubbonti .

## 2024-06-18 NOTE — Telephone Encounter (Signed)
 Ben with [facility] called asking best delivery method for pt's Jubbonti .  Rx scheduled to be delivered 9/19. Package will need signed for.

## 2024-06-19 DIAGNOSIS — Z1231 Encounter for screening mammogram for malignant neoplasm of breast: Secondary | ICD-10-CM | POA: Diagnosis not present

## 2024-06-20 NOTE — Telephone Encounter (Addendum)
 Last Prolia  inj 01/16/24 Jubbonti  inj due 07/18/24   Called pt and advised. She will call back to schedule nurse visit.

## 2024-06-20 NOTE — Telephone Encounter (Signed)
 Scheduled Nurse visit with pt for 07/18/2024.

## 2024-06-23 NOTE — Telephone Encounter (Signed)
 Prescription received from [facility].

## 2024-06-24 DIAGNOSIS — H26491 Other secondary cataract, right eye: Secondary | ICD-10-CM | POA: Diagnosis not present

## 2024-07-16 DIAGNOSIS — K2930 Chronic superficial gastritis without bleeding: Secondary | ICD-10-CM | POA: Diagnosis not present

## 2024-07-16 DIAGNOSIS — Z9081 Acquired absence of spleen: Secondary | ICD-10-CM | POA: Diagnosis not present

## 2024-07-18 ENCOUNTER — Ambulatory Visit

## 2024-07-18 DIAGNOSIS — M810 Age-related osteoporosis without current pathological fracture: Secondary | ICD-10-CM

## 2024-07-18 MED ORDER — DENOSUMAB-BBDZ 60 MG/ML ~~LOC~~ SOSY
60.0000 mg | PREFILLED_SYRINGE | Freq: Once | SUBCUTANEOUS | Status: AC
  Administered 2024-07-18: 60 mg via SUBCUTANEOUS

## 2024-07-18 NOTE — Progress Notes (Signed)
 Patient received JUBBONTI  injection today, 1mL, SQ, right upper arm,  tolerated well.   Pharmacy Benefit  MFG: [employer] LOT#: [id number] EXP: 2027-MAR NDC: [id number]

## 2024-07-18 NOTE — Telephone Encounter (Signed)
 Last Jubbonti  injection 07/18/24 Next Jubbonti  injection due 01/17/25

## 2024-10-29 ENCOUNTER — Telehealth: Payer: Self-pay | Admitting: Cardiology

## 2024-10-29 NOTE — Telephone Encounter (Signed)
 Spoke with patient regarding shortness of breath  Has been having shortness of breath for couple of months, concerned about possible changes in echo, worse with exertion  Having some ankle swelling, goes down at night   Scheduled follow up Monday 2/2 with Dr Lonni

## 2024-10-29 NOTE — Telephone Encounter (Signed)
 Pt c/o Shortness Of Breath: STAT if SOB developed within the last 24 hours or pt is noticeably SOB on the phone  1. Are you currently SOB (can you hear that pt is SOB on the phone)? no  2. How long have you been experiencing SOB? A couple months   3. Are you SOB when sitting or when up moving around? Moving around   4. Are you currently experiencing any other symptoms? No    Please advise.

## 2024-11-03 ENCOUNTER — Encounter (HOSPITAL_BASED_OUTPATIENT_CLINIC_OR_DEPARTMENT_OTHER): Payer: Self-pay

## 2024-11-03 ENCOUNTER — Ambulatory Visit (HOSPITAL_BASED_OUTPATIENT_CLINIC_OR_DEPARTMENT_OTHER): Admitting: Cardiology

## 2025-01-15 ENCOUNTER — Ambulatory Visit (HOSPITAL_BASED_OUTPATIENT_CLINIC_OR_DEPARTMENT_OTHER): Admitting: Cardiology

## 2025-01-19 ENCOUNTER — Ambulatory Visit: Admitting: Family Medicine
